# Patient Record
Sex: Male | Born: 2000 | Race: White | Hispanic: No | Marital: Single | Smoking: Current every day smoker
Health system: Southern US, Community
[De-identification: ages and names within clinical notes are randomized; demographics above are authoritative.]

## PROBLEM LIST (undated history)

## (undated) ENCOUNTER — Emergency Department (HOSPITAL_COMMUNITY): Admission: EM | Payer: Medicaid Other | Source: Home / Self Care

## (undated) DIAGNOSIS — F909 Attention-deficit hyperactivity disorder, unspecified type: Secondary | ICD-10-CM

## (undated) DIAGNOSIS — F429 Obsessive-compulsive disorder, unspecified: Secondary | ICD-10-CM

---
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
## 2022-05-05 ENCOUNTER — Encounter (HOSPITAL_COMMUNITY): Payer: Self-pay | Admitting: Registered Nurse

## 2022-05-05 ENCOUNTER — Ambulatory Visit (HOSPITAL_COMMUNITY)
Admission: EM | Admit: 2022-05-05 | Discharge: 2022-05-05 | Disposition: A | Discharge status: Home / Self Care | Payer: Medicaid Other | Attending: Registered Nurse | Admitting: Registered Nurse

## 2022-05-05 DIAGNOSIS — F902 Attention-deficit hyperactivity disorder, combined type: Secondary | ICD-10-CM

## 2022-05-05 DIAGNOSIS — F70 Mild intellectual disabilities: Secondary | ICD-10-CM

## 2022-05-05 DIAGNOSIS — R4689 Other symptoms and signs involving appearance and behavior: Secondary | ICD-10-CM

## 2022-05-05 DIAGNOSIS — F918 Other conduct disorders: Secondary | ICD-10-CM | POA: Insufficient documentation

## 2022-05-05 DIAGNOSIS — Z046 Encounter for general psychiatric examination, requested by authority: Secondary | ICD-10-CM

## 2022-05-05 MED ORDER — GUANFACINE HCL ER 2 MG PO TB24
4.0000 mg | ORAL_TABLET | ORAL | Status: AC
  Administered 2022-05-05: 4 mg via ORAL
  Filled 2022-05-05: qty 2

## 2022-05-05 MED ORDER — DIVALPROEX SODIUM 500 MG PO DR TAB
750.0000 mg | DELAYED_RELEASE_TABLET | ORAL | Status: AC
  Administered 2022-05-05: 750 mg via ORAL
  Filled 2022-05-05: qty 1

## 2022-05-05 MED ORDER — LORAZEPAM 1 MG PO TABS
2.0000 mg | ORAL_TABLET | ORAL | Status: AC
  Administered 2022-05-05: 2 mg via ORAL
  Filled 2022-05-05: qty 2

## 2022-05-05 MED ORDER — METOPROLOL TARTRATE 25 MG PO TABS: 25.0000 mg | ORAL_TABLET | Freq: Every morning | ORAL | Status: DC

## 2022-05-05 MED ORDER — DIVALPROEX SODIUM 500 MG PO DR TAB: 750.0000 mg | DELAYED_RELEASE_TABLET | Freq: Two times a day (BID) | ORAL | Status: DC

## 2022-05-05 MED ORDER — CLOZAPINE 25 MG PO TABS
150.0000 mg | ORAL_TABLET | ORAL | Status: AC
  Administered 2022-05-05: 150 mg via ORAL
  Filled 2022-05-05: qty 2

## 2022-05-05 MED ORDER — LEVOTHYROXINE SODIUM 25 MCG PO TABS: 25.0000 ug | ORAL_TABLET | Freq: Every day | ORAL | Status: DC

## 2022-05-05 MED ORDER — METOPROLOL TARTRATE 25 MG PO TABS
25.0000 mg | ORAL_TABLET | ORAL | Status: AC
  Administered 2022-05-05: 25 mg via ORAL
  Filled 2022-05-05: qty 1

## 2022-05-05 MED ORDER — GUANFACINE HCL ER 2 MG PO TB24: 4.0000 mg | ORAL_TABLET | Freq: Every day | ORAL | Status: DC

## 2022-05-05 MED ORDER — LEVOTHYROXINE SODIUM 25 MCG PO TABS
25.0000 ug | ORAL_TABLET | ORAL | Status: AC
  Administered 2022-05-05: 25 ug via ORAL
  Filled 2022-05-05: qty 1

## 2022-05-05 MED ORDER — CLOZAPINE 25 MG PO TABS: 150.0000 mg | ORAL_TABLET | Freq: Every day | ORAL | Status: DC

## 2022-05-05 MED ORDER — LORAZEPAM 1 MG PO TABS: 2.0000 mg | ORAL_TABLET | Freq: Once | ORAL | Status: DC

## 2022-05-05 NOTE — ED Provider Notes (Signed)
Behavioral Health Urgent Care Medical Screening Exam  Patient Name: Stephen Robinson MRN: [id number] Date of Evaluation: 05/05/22 Chief Complaint:  IVC Diagnosis:  Final diagnoses:  Involuntary commitment  Aggressive behavior of adult  Mild intellectual disability  ADHD (attention deficit hyperactivity disorder), combined type    History of Present illness: Stephen Robinson is a 22 y.o. male patient presented to [facility] as a walk in voluntarily via [employer].  Officer reports that staff at [facility] called police because patient had been sitting outside on a bench asking multiple people for help.  Stephen Robinson, 24 y.o., male patient seen face to face by this provider, consulted with Dr. Hampton Abbot; and chart reviewed on 05/05/22.  On evaluation Stephen Robinson reports he had been grocery shopping "cause I was hungry."  States he was asking people for help "because I needed a ride back to my motel."   Patient states that he was released from jail last night.  States he talked to his Guardian Stephen Robinson and "she knows that I won't going back to the assisted living.  She said that I was grown and she couldn't make me go back, but I had to let her know where I was staying.  But I forgot to call her last night.  Patient reports that he had went to the grocery store today but couldn't get back to the motel and was asking for assistance to get back to the motel. Patient reports that he is grown and that he does not have to go back to the assistant living facility. Patient denies suicidal/self-harm/homicidal ideation, psychosis, paranoia.  Patient gave permission to speak to his legal guardian During evaluation Stephen Robinson is sitting in chair with no noted distress.  He is alert/oriented x 4, calm, cooperative, attentive, and responses were relevant and appropriate to assessment questions.  He spoke in a clear tone at moderate volume, and normal pace, with good eye  contact.   He denies suicidal/self-harm/homicidal ideation, psychosis, and paranoia.  Objectively:  there is no evidence of psychosis/mania or delusional thinking.  He conversed coherently, with goal directed thoughts, and no distractibility, or pre-occupation and he has denied suicidal/self-harm/homicidal ideation, psychosis, and paranoia.   Collateral information:  Spoke to [name] (legal guardian) and Stephen Robinson (ALF staff person).  Stephen Robinson states reports that patient had his first appearance in court yesterday related to stealing, breaking convenience store window, and spitting on a Engineer, structural.  "We Got there 5 minutes too late. Police let him sign himself out. When we met with him, he did not want to leave with Stephen Robinson. The police told him he could either go back to the group home or he could stay in jail. He agreed to go back to the group home, but he wanted to go by the hospital first because he said it felt like his collarbone was broken. He was taken to the hospital and after he was released from the hospital, he told he said that he wasn't going back to the assisted living facility that he wanted to be on his own. I wasn't able to force him to go back to the group home." Report patient has been deemed incompetent and he is unable to his own, but he wants to, and he wants to be independent.  But he is incapable of making sound decisions.  He hs been out of jail one night and already has a girlfriend living in a motel.  With someone he really doesn't know."  Group home staff person Stephen Robinson reports they don't have a problem with patient coming back to assisted living facility. Reports patient has missed 2 days of his medications. And would like for patient to take medications prior to his discharge just in case he refuses when he gets home.  States she will fax a updated list of medications over.  Informed that medications could be given.  Also inform staff that patient has been calm and cooperative while he  was here, he is conversing coherently with staff, with no agitation, or behavior outburst while here in facility.  Patient has denied suicidal, homicidal ideation psychosis, and paranoia.  The incident that is stated on the IVC is nothing that just occurred, and patient couldn't be forced to stay here.  Understanding voice. Reported they would like to speak with patient, and they could also arrange transportation for patient to get back to the care facility.    [facility] from 02/18/2021 in [facility] Emergency Department at [facility] ED from 11/20/2020 in [facility] Emergency Department at [facility] ED from 10/30/2020 in [facility] Emergency Department at [facility] High Risk No Risk No Risk       Psychiatric Specialty Exam  Presentation  General Appearance:Appropriate for Environment  Eye Contact:Good  Speech:Clear and Coherent; Normal Rate  Speech Volume:Normal  Handedness:Right   Mood and Affect  Mood: Euthymic  Affect: Appropriate; Congruent   Thought Process  Thought Processes: Coherent; Goal Directed  Descriptions of Associations:Intact  Orientation:Full (Time, Place and Person)  Thought Content:Logical  Diagnosis of Schizophrenia or Schizoaffective disorder in past: No data recorded  Hallucinations:None  Ideas of Reference:None  Suicidal Thoughts:No  Homicidal Thoughts:No   Sensorium  Memory: Immediate Good; Recent Good; Remote Fair  Judgment: Fair  Insight: Present   Executive Functions  Concentration: Fair  Attention Span: Fair  Recall: Good  Fund of Knowledge: Fair  Language: Good   Psychomotor Activity  Psychomotor Activity: Normal   Assets  Assets: Communication Skills; Desire for Improvement; Financial Resources/Insurance; Leisure Time; Physical Health; Social Support   Sleep  Sleep: Good  Number of hours: No data recorded  Physical Exam: Physical  Exam Vitals and nursing note reviewed. Exam conducted with a chaperone present.  Constitutional:      General: He is not in acute distress.    Appearance: Normal appearance. He is not ill-appearing.  HENT:     Head: Normocephalic.  Eyes:     Conjunctiva/sclera: Conjunctivae normal.  Cardiovascular:     Rate and Rhythm: Normal rate.  Pulmonary:     Effort: Pulmonary effort is normal. No respiratory distress.  Musculoskeletal:        General: Normal range of motion.     Cervical back: Normal range of motion.  Skin:    General: Skin is warm and dry.  Neurological:     Mental Status: He is alert and oriented to person, place, and time.  Psychiatric:        Attention and Perception: Attention and perception normal. He does not perceive auditory or visual hallucinations.        Mood and Affect: Mood and affect normal.        Speech: Speech normal.        Behavior: Behavior normal. Behavior is cooperative.        Thought Content: Thought content normal. Thought content is not paranoid or delusional. Thought content does not include homicidal or suicidal ideation.  Judgment: Judgment is impulsive.    Review of Systems  Psychiatric/Behavioral:  Depression: Denies. Hallucinations: States he smoked  joint today.  I didn't get high just lil bit high. Substance abuse: Denies. Suicidal ideas: Denies. The patient does not have insomnia. Nervous/anxious: Denies.  All other systems reviewed and are negative.  Blood pressure (!) 152/85, pulse 89, temperature 98.5 F (36.9 C), temperature source Oral, resp. rate 19, SpO2 100 %. There is no height or weight on file to calculate BMI.  Musculoskeletal: Strength & Muscle Tone: within normal limits Gait & Station: normal Patient leans: N/A   [place] MSE Discharge Disposition for Follow up and Recommendations: Based on my evaluation the patient does not appear to have an emergency medical condition and can be discharged with resources and  follow up care in outpatient services for Medication Management, Individual Therapy, and Psychiatrically cleared to go back to group home.    Rescind IVC After thorough evaluation and review of information currently presented on assessment of Dhruvan Gullion (respondent), there is insufficient findings to indicate respondent meets criteria for involuntary commitment or require an inpatient level of care.  Respondent is alert/oriented x 4, calm, cooperative, and mood congruent with affect.  Respondent is speaking in a clear tone at moderate volume, and normal pace, with good eye contact.  Respondents' thought process is coherent, relevant, and there is no indication that the respondent is currently responding to internal/external stimuli or experiencing delusional thought content.  Respondent denies suicidal/self-harm/homicidal ideation, psychosis, and paranoia.  Respondent has remained calm and cooperative throughout assessment and responded to questions appropriately.  Currently respondent is not significantly impaired, psychotic, or manic on exam.  A detailed risk assessment has been completed based on clinical exam and individual risk factors.  There is no evidence of imminent risk to self or others at present and respondent does not meet criteria for psychiatric inpatient admission.  Medications Ordered to be given prior to discharge Meds ordered this encounter  Medications   cloZAPine (CLOZARIL) tablet 150 mg   divalproex (DEPAKOTE) DR tablet 750 mg   guanFACINE (INTUNIV) ER tablet 4 mg   levothyroxine (SYNTHROID) tablet 25 mcg   metoprolol tartrate (LOPRESSOR) tablet 25 mg     Kitt Minardi, NP 05/05/2022, 7:03 PM

## 2022-05-05 NOTE — Progress Notes (Signed)
PHARMACIST - PHYSICIAN ORDER COMMUNICATION  Stephen Robinson is a 22 y.o. year old male with a history of  schizophrenia on Clozapine PTA. Continuing this medication order as an inpatient requires that monitoring parameters per REMS requirements must be met.   Clozapine REMS Dispense Authorization was obtained, and will dispense inpatient.  RDA code [id number].  Verified Clozapine dose:    Last ANC value and date reported on the Clozapine REMS website: 6500 on 04/26/2022 [place] monitoring frequency: weekly Next [place] reporting is due on (date) 05/03/22.  Eudelia Bunch, Pharm.D 05/05/2022 7:16 PM

## 2022-05-05 NOTE — BH Assessment (Signed)
LCSW Progress Note   LCSW contacted legal guardian, Stephen Robinson @ [phone] and left a VM requesting a call back.  LCSW contacted Ms. Stephen Robinson at [phone] to discuss discharge and inform her that he will be given his evening medications prior to leaving the facility.  She stated she will be here between 2030-2100.   Omelia Blackwater, MSW, [place] phone [phone] fax

## 2022-05-05 NOTE — Discharge Instructions (Signed)
Take all of you medications as prescribed by your mental healthcare provider.  Report any adverse effects and reactions from your medications to your outpatient provider promptly.  Do not engage in alcohol and or illegal drug use while on prescription medicines. Keep all scheduled appointments. This is to ensure that you are getting refills on time and to avoid any interruption in your medication.  If you are unable to keep an appointment call to reschedule.  Be sure to follow up with resources and follow ups given. In the event of worsening symptoms call the crisis hotline, 911, and or go to the nearest emergency department for appropriate evaluation and treatment of symptoms. Follow-up with your primary care provider for your medical issues, concerns and or health care needs.    [facility] Address: [address], [place], [place] [address] Phone: [phone]  Supported Employment The supported employment program is a person-centered, individualized, evidence-based support service that helps members choose, acquire, and maintain competitive employment in our community. This service supports the varying needs of individuals and promotes community inclusion and employment success. Members enrolled in the supported employment program can expect the following:  Development of an individual career plan Community based job placement Job shadowing Job development On-site job Teacher, early years/pre and support  Supported Education Supported education helps our members receive the education and training they need to achieve their learning and recovery goals. This will assist members with becoming gainfully employed in the job or career of their choice. The program includes assistance with: Registering for disability accommodations Enrolling in school and registering for classes Learning communication skills Scheduling tutoring sessions within your school [facility] partners with  Vocational Rehabilitation to help increase the success of clients seeking employment and educational goals.  Want to learn more about our programs?   Please contact our intake department INTAKE: [phone] Ext [phone]  Mailing: Hand   [place], [place] [address]   [email or web address]

## 2022-05-05 NOTE — ED Notes (Signed)
[name] called for pickup.

## 2022-05-05 NOTE — Progress Notes (Signed)
   05/05/22 1720  [place] (Walk-ins at [facility] only)  How Did You Hear About [name]? Legal System  What Is the Reason for Your Visit/Call Today? Patient presents via [employer], under IVC initiated by QP with Care Home where he has been residing.  Patient was picked up at [facility], when he called 911 for assistance with "a ride back to the [facility]."  Patient was in the care home, howeer has refused to return.  He was taken to jail for larceny and released on the condition by guardian, Tommie Ard, that he return to care home.  Patient wanted to walk back to the facility and then walked to this motel instead.  There was concern by caretakers that he has been off meds for a few days and doesn't have his meds with him.  Patient has had multiple inpatient admissions, with recent [facility] admission, and they are concerned he will decompensate without medication.  Patient is requesting to return to the motel, stating he has met a couple of male friends that are letting him stay in their room.  He also states he has "a girlfriend" that stays there.  Guardian, Jonte GXQ([phone]) was contacted and shares concerns that he is deemed incompetent and is unable to live on his own.  Patient is low functioning and struggles with prorcessing and decision making.  He denies SI, HI and AVH.  He is calm and cooperative at this time.  Guardian informed that IVC criteria listed is old information from before he was incarcerated. Guardian has requested patient get his pm medications and would like patient to speak wiht care home staff, as they would like to ask him to return.  Patient is psych cleared by provider.  How Long Has This Been Causing You Problems? <Week  Have You Recently Had Any Thoughts About Hurting Yourself? No  Are You Planning to Commit Suicide/Harm Yourself At This time? No  Have you Recently Had Thoughts About [place]? No  Are You Planning To Harm Someone At This Time? No  Are you currently  experiencing any auditory, visual or other hallucinations? No  Have You Used Any Alcohol or Drugs in the Past 24 Hours? Yes  How long ago did you use Drugs or Alcohol? today  What Did You Use and How Much? THC "just a little"  Do you have any current medical co-morbidities that require immediate attention? No  Clinician description of patient physical appearance/behavior: calm, cooperative AAOx4, limited insight due to cognitive impairment  What Do You Feel Would Help You the Most Today? Social Support  If access to [facility] Urgent Care was not available, would you have sought care in the Emergency Department? No  Determination of Need Routine (7 days)  Options For Referral Other: Comment (coordination with [facility], re: patient returning to facility vs motel where he stayed last night. Facility staff plan to speak with patient to invite him to return.)

## 2022-07-09 ENCOUNTER — Ambulatory Visit (HOSPITAL_COMMUNITY)
Admission: EM | Admit: 2022-07-09 | Discharge: 2022-07-10 | Disposition: A | Discharge status: Home / Self Care | Payer: Medicaid Other | Attending: Urology | Admitting: Urology

## 2022-07-09 ENCOUNTER — Other Ambulatory Visit: Payer: Self-pay

## 2022-07-09 ENCOUNTER — Encounter (HOSPITAL_COMMUNITY): Payer: Self-pay | Admitting: Emergency Medicine

## 2022-07-09 DIAGNOSIS — R45851 Suicidal ideations: Secondary | ICD-10-CM | POA: Diagnosis not present

## 2022-07-09 DIAGNOSIS — R45850 Homicidal ideations: Secondary | ICD-10-CM | POA: Insufficient documentation

## 2022-07-09 DIAGNOSIS — Z9151 Personal history of suicidal behavior: Secondary | ICD-10-CM | POA: Insufficient documentation

## 2022-07-09 DIAGNOSIS — I517 Cardiomegaly: Secondary | ICD-10-CM | POA: Insufficient documentation

## 2022-07-09 DIAGNOSIS — Z1152 Encounter for screening for COVID-19: Secondary | ICD-10-CM | POA: Insufficient documentation

## 2022-07-09 DIAGNOSIS — F913 Oppositional defiant disorder: Secondary | ICD-10-CM | POA: Insufficient documentation

## 2022-07-09 DIAGNOSIS — R441 Visual hallucinations: Secondary | ICD-10-CM | POA: Insufficient documentation

## 2022-07-09 DIAGNOSIS — F1290 Cannabis use, unspecified, uncomplicated: Secondary | ICD-10-CM | POA: Insufficient documentation

## 2022-07-09 DIAGNOSIS — F902 Attention-deficit hyperactivity disorder, combined type: Secondary | ICD-10-CM | POA: Insufficient documentation

## 2022-07-09 DIAGNOSIS — F17210 Nicotine dependence, cigarettes, uncomplicated: Secondary | ICD-10-CM | POA: Insufficient documentation

## 2022-07-09 DIAGNOSIS — R4689 Other symptoms and signs involving appearance and behavior: Secondary | ICD-10-CM

## 2022-07-09 DIAGNOSIS — Z765 Malingerer [conscious simulation]: Secondary | ICD-10-CM | POA: Diagnosis not present

## 2022-07-09 DIAGNOSIS — Z79899 Other long term (current) drug therapy: Secondary | ICD-10-CM | POA: Insufficient documentation

## 2022-07-09 DIAGNOSIS — R440 Auditory hallucinations: Secondary | ICD-10-CM | POA: Insufficient documentation

## 2022-07-09 DIAGNOSIS — Z653 Problems related to other legal circumstances: Secondary | ICD-10-CM | POA: Insufficient documentation

## 2022-07-09 LAB — POCT URINE DRUG SCREEN - MANUAL ENTRY (I-SCREEN)
POC Amphetamine UR: NOT DETECTED
POC Buprenorphine (BUP): NOT DETECTED
POC Cocaine UR: NOT DETECTED
POC Marijuana UR: NOT DETECTED
POC Methadone UR: NOT DETECTED
POC Methamphetamine UR: NOT DETECTED
POC Morphine: NOT DETECTED
POC Oxazepam (BZO): NOT DETECTED
POC Oxycodone UR: NOT DETECTED
POC Secobarbital (BAR): NOT DETECTED

## 2022-07-09 LAB — COMPREHENSIVE METABOLIC PANEL
ALT: 23 U/L (ref 0–44)
AST: 47 U/L — ABNORMAL HIGH (ref 15–41)
Albumin: 4.3 g/dL (ref 3.5–5.0)
Alkaline Phosphatase: 71 U/L (ref 38–126)
Anion gap: 12 (ref 5–15)
BUN: 8 mg/dL (ref 6–20)
CO2: 26 mmol/L (ref 22–32)
Calcium: 10 mg/dL (ref 8.9–10.3)
Chloride: 101 mmol/L (ref 98–111)
Creatinine, Ser: 0.76 mg/dL (ref 0.61–1.24)
GFR, Estimated: >60 mL/min (ref >60)
Glucose, Bld: 92 mg/dL (ref 70–99)
Potassium: 4.1 mmol/L (ref 3.5–5.1)
Sodium: 139 mmol/L (ref 135–145)
Total Bilirubin: 0.6 mg/dL (ref 0.3–1.2)
Total Protein: 7.3 g/dL (ref 6.5–8.1)

## 2022-07-09 LAB — CBC WITH DIFFERENTIAL/PLATELET
Abs Immature Granulocytes: 0.03 10*3/uL (ref 0.00–0.07)
Basophils Absolute: 0 10*3/uL (ref 0.0–0.1)
Basophils Relative: 0 %
Eosinophils Absolute: 0.1 10*3/uL (ref 0.0–0.5)
Eosinophils Relative: 1 %
HCT: 36.7 % — ABNORMAL LOW (ref 39.0–52.0)
Hemoglobin: 13.4 g/dL (ref 13.0–17.0)
Immature Granulocytes: 0 %
Lymphocytes Relative: 33 %
Lymphs Abs: 3.3 10*3/uL (ref 0.7–4.0)
MCH: 31.6 pg (ref 26.0–34.0)
MCHC: 36.5 g/dL — ABNORMAL HIGH (ref 30.0–36.0)
MCV: 86.6 fL (ref 80.0–100.0)
Monocytes Absolute: 1.1 10*3/uL — ABNORMAL HIGH (ref 0.1–1.0)
Monocytes Relative: 10 %
Neutro Abs: 5.6 10*3/uL (ref 1.7–7.7)
Neutrophils Relative %: 56 %
Platelets: 236 10*3/uL (ref 150–400)
RBC: 4.24 MIL/uL (ref 4.22–5.81)
RDW: 13.5 % (ref 11.5–15.5)
WBC: 10.2 10*3/uL (ref 4.0–10.5)
nRBC: 0 % (ref 0.0–0.2)

## 2022-07-09 LAB — LIPID PANEL
Cholesterol: 170 mg/dL (ref 0–200)
HDL: 43 mg/dL (ref >40)
LDL Cholesterol: 98 mg/dL (ref 0–99)
Total CHOL/HDL Ratio: 4 RATIO
Triglycerides: 147 mg/dL (ref <150)
VLDL: 29 mg/dL (ref 0–40)

## 2022-07-09 LAB — VALPROIC ACID LEVEL: Valproic Acid Lvl: 59 ug/mL (ref 50.0–100.0)

## 2022-07-09 LAB — TSH: TSH: 6.001 u[IU]/mL — ABNORMAL HIGH (ref 0.350–4.500)

## 2022-07-09 LAB — POC SARS CORONAVIRUS 2 AG: SARSCOV2ONAVIRUS 2 AG: NEGATIVE

## 2022-07-09 LAB — SARS CORONAVIRUS 2 BY RT PCR: SARS Coronavirus 2 by RT PCR: NEGATIVE

## 2022-07-09 LAB — ETHANOL: Alcohol, Ethyl (B): <10 mg/dL (ref <10)

## 2022-07-09 MED ORDER — DIVALPROEX SODIUM 500 MG PO DR TAB
750.0000 mg | DELAYED_RELEASE_TABLET | Freq: Two times a day (BID) | ORAL | Status: DC
  Administered 2022-07-09 – 2022-07-10 (×2): 750 mg via ORAL
  Filled 2022-07-09 (×2): qty 1

## 2022-07-09 MED ORDER — HYDROXYZINE HCL 25 MG PO TABS
25.0000 mg | ORAL_TABLET | Freq: Three times a day (TID) | ORAL | Status: DC | PRN
  Administered 2022-07-09 – 2022-07-10 (×2): 25 mg via ORAL
  Filled 2022-07-09 (×2): qty 1

## 2022-07-09 MED ORDER — LEVOTHYROXINE SODIUM 25 MCG PO TABS
25.0000 ug | ORAL_TABLET | Freq: Every day | ORAL | Status: DC
  Administered 2022-07-10: 25 ug via ORAL
  Filled 2022-07-09: qty 1

## 2022-07-09 MED ORDER — CLOZAPINE 25 MG PO TABS
150.0000 mg | ORAL_TABLET | Freq: Every day | ORAL | Status: DC
  Administered 2022-07-09: 150 mg via ORAL
  Filled 2022-07-09: qty 2

## 2022-07-09 MED ORDER — HALOPERIDOL 5 MG PO TABS
10.0000 mg | ORAL_TABLET | Freq: Four times a day (QID) | ORAL | Status: DC | PRN
  Administered 2022-07-09: 10 mg via ORAL
  Filled 2022-07-09: qty 2

## 2022-07-09 MED ORDER — CLOZAPINE 25 MG PO TABS
75.0000 mg | ORAL_TABLET | Freq: Every morning | ORAL | Status: DC
  Administered 2022-07-10: 75 mg via ORAL
  Filled 2022-07-09: qty 3

## 2022-07-09 MED ORDER — DIPHENHYDRAMINE HCL 50 MG PO CAPS
50.0000 mg | ORAL_CAPSULE | Freq: Four times a day (QID) | ORAL | Status: DC | PRN
  Administered 2022-07-09: 50 mg via ORAL
  Filled 2022-07-09: qty 1

## 2022-07-09 MED ORDER — GUANFACINE HCL ER 2 MG PO TB24
4.0000 mg | ORAL_TABLET | Freq: Every day | ORAL | Status: DC
  Administered 2022-07-09: 4 mg via ORAL
  Filled 2022-07-09: qty 2

## 2022-07-09 MED ORDER — ALUM & MAG HYDROXIDE-SIMETH 200-200-20 MG/5ML PO SUSP: 30.0000 mL | ORAL | Status: DC | PRN

## 2022-07-09 MED ORDER — METOPROLOL TARTRATE 25 MG PO TABS
12.5000 mg | ORAL_TABLET | Freq: Every morning | ORAL | Status: DC
  Administered 2022-07-10: 12.5 mg via ORAL
  Filled 2022-07-09: qty 1

## 2022-07-09 MED ORDER — ACETAMINOPHEN 325 MG PO TABS: 650.0000 mg | ORAL_TABLET | Freq: Four times a day (QID) | ORAL | Status: DC | PRN

## 2022-07-09 MED ORDER — MAGNESIUM HYDROXIDE 400 MG/5ML PO SUSP: 30.0000 mL | Freq: Every day | ORAL | Status: DC | PRN

## 2022-07-09 NOTE — ED Provider Notes (Incomplete)
[facility]gent [facility] Continuous Assessment Admission H&P  Date: 07/10/22 Patient Name: Stephen Robinson MRN: [id number] Chief Complaint: "I'm suicidal"  Diagnoses:  Final diagnoses:  Attention deficit hyperactivity disorder (ADHD), combined type  Aggressive behavior  Malingering    HPI: Stephen Robinson is is a 22 year old male with history of IDD, ADHD, ADHD, oppositional defiant behavior, and aggressive behavior.  Patient was brought voluntarily to [facility] by law enforcement for a walk-in assessment after the patient eloped from his day program.  Patient was evaluated face-to-face and his chart was reviewed by this nurse practitioner.  On evaluation, patient is alert and oriented x 4, patient is anxious but cooperative.  He speaks in a clear tone of voice, increased volume, and normal pace.  Patient's mood is anxious and his affect is congruent with his mood.  Patient's thought process is coherent.  Objectively, no signs of mania, psychosis, preoccupation, or delusional thought content noted during assessment.  Patient acknowledges that he left his day program without permission. He says he walked to a golf [place] with a knife that he found and was threatening to stab himself and someone called the police. He says he threw the knife in a creek. Patient is diagnosed with IDD and has a legal guardian, Stephen Robinson [phone] with [employer] [facility] [employer] and resides at ALF (ALF director Stephen Robinson, LCSWA [phone]). Patient reports that he does not want to return to ALF. He says he is having suicidal thoughts with plan to cut his wrist or jump out of a window head first.  He says he is unable to contract for safety and would commit suicidal if discharge to his current ALF. He also endorses homicidal ideation towards ALF "male staffs" and says he would destroy properties if D/C to his current ALF. He is requesting to be sent to [facility] because "they have a lot of activities."    Patient denies depressive symptoms. He endorses visual hallucination of "imaginary friend name Stephen Robinson" and auditory hallucination "conversations with Stephen Robinson."  He denies paranoia. He says he steal alcoholic beverages from stores. He reports "I drink as much alcohol as I can get." He is unsure when he last had an alcoholic drink. He says he vapes nicotine daily. He denies all other substance use. He has history of stealing from stores and was recently arrested for stealing food from a store and threatening the owner of the store. He was charged with 2 felonies and 2 misdemeanors, including assaulting a Emergency planning/management officer. Patient reports that he has access to guns and knives; and that he robbed a [employer] at gun point however ALF staff says this is untrue. She reports that patient is obsessed with guns and makes up stories about robbing people at gun point. She says patient has no access to weapon, she says all knives and potential weapons at ALF are locked away. ALF director Stephen Robinson denies safety concerns and says patient may return to ALF however patient continues to make threats to harm himself and others if discharge to current ALF.    " Ms Stephen Robinson states Pt has been in DSS custody since age 22 and has been in multiple placements. She says he has a long history of elopement from various placements. She states he has a fascination with Designer, television/film set, seeks out interactions with them, and dreams of having a career in Patent examiner. She says he recently turned 22 and is frustrated he has to live in a structured  environment. She describes his current placement as being the most stable and that Pt has a lot of support, including medication management, day treatment program, a behavioral support plan, and legal guardian. Ms Stephen Robinson spoke with Pt about his concerns regarding the AFL and day treatment but Pt was says he wants to live somewhere else. 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 Pt recently went to jail after stealing food from a store and threatening the store owner. He was charged with two felonies and two misdemeanors, including assaulting a Hydrographic surveyor. Pt says he had a gun and was charged with armed robbery but Ms Stephen Robinson says this is not true, that Pt stole food and broke a window. He currently has no legal charges. Pt says he has been psychiatrically hospitalized in the past and would like to return to [facility], that they have a lot of activities there. He says he takes all his medications when they are given to him.   Home medication per Ms Stephen Robinson Divaproex 750mg  twice/day  Clonazepam 1 mg twice daily as needed for anxiety (patient has not been taking this since  he was started on zyprexa)  Benadryl 50 mg as needed for agitation Guanfacine 4 mg/day Levothyroxine 25 mcg Qam Metoprolol 12.5mg /day Haldol 10mg  every 4 hours as needed for agitation Clozaril 75mg  every morning and 150mg  at night Zyprexa 5mg  BID as needed for agitation   Total Time spent with patient: 30 minutes  Musculoskeletal  Strength & Muscle Tone: within normal limits Gait & Station: normal Patient leans: Right  Psychiatric Specialty Exam  Presentation General Appearance:  Appropriate for Environment  Eye Contact: Good  Speech: Clear and Coherent  Speech Volume: Increased  Handedness: Right   Mood and Affect  Mood: Euthymic  Affect: Congruent   Thought Process  Thought Processes: Goal Directed  Descriptions of Associations:Intact  Orientation:Full (Time, Place and Person)  Thought Content:WDL  Diagnosis of Schizophrenia or Schizoaffective disorder in past: No   Hallucinations:Hallucinations: Auditory; Visual Description of Auditory Hallucinations: "conversation with imaginary friend Stephen Robinson" Description of  Visual Hallucinations: "I see Stephen Robinson, my imaginary friend"  Ideas of Reference:None  Suicidal Thoughts:Suicidal Thoughts: Yes, Active SI Active Intent and/or Plan: With Plan; With Intent  Homicidal Thoughts:Homicidal Thoughts: Yes, Active HI Active Intent and/or Plan: With Plan; With Intent   Sensorium  Memory: Immediate Good; Recent Fair; Remote Fair  Judgment: Poor  Insight: Fair   Chartered certified accountant: Fair  Attention Span: Fair  Recall: [employer] of Knowledge: Fair  Language: Fair   Psychomotor Activity  Psychomotor Activity: Psychomotor Activity: Normal   Assets  Assets: Manufacturing systems engineer; Physical Health; Transportation; Social Support   Sleep  Sleep: Sleep: Good Number of Hours of Sleep: 8   Nutritional Assessment (For OBS and FBC admissions only) Has the patient had a weight loss or gain of 10 pounds or more in the last 3 months?: No Has the patient had a decrease in food intake/or appetite?: No Does the patient have dental problems?: No Does the patient have eating habits or behaviors that may be indicators of an eating disorder including binging or inducing vomiting?: No Has the patient recently lost weight without trying?: 0 Has the patient been eating poorly because of a decreased appetite?: 0 Malnutrition Screening Tool Score: 0    Physical Exam Vitals and nursing note reviewed.  Constitutional:      General: He is not in acute distress.    Appearance: He is well-developed.  HENT:     Head: Normocephalic and atraumatic.  Eyes:     Conjunctiva/sclera: Conjunctivae normal.  Cardiovascular:     Rate and Rhythm: Normal rate.     Heart sounds: No murmur heard. Pulmonary:     Effort: Pulmonary effort is normal. No respiratory distress.  Abdominal:     Palpations: Abdomen is soft.     Tenderness: There is no abdominal tenderness.  Musculoskeletal:        General: No swelling.     Cervical back: Neck supple.   Skin:    General: Skin is warm and dry.     Capillary Refill: Capillary refill takes less than 2 seconds.  Neurological:     Mental Status: He is alert and oriented to person, place, and time. Mental status is at baseline.  Psychiatric:        Attention and Perception: He perceives auditory and visual hallucinations.        Mood and Affect: Mood is anxious.        Speech: Speech normal.        Behavior: Behavior normal. Behavior is cooperative.        Thought Content: Thought content is not paranoid or delusional. Thought content includes homicidal and suicidal ideation. Thought content includes homicidal and suicidal plan.    Review of Systems  Constitutional: Negative.   HENT: Negative.    Eyes: Negative.   Respiratory: Negative.    Cardiovascular: Negative.   Gastrointestinal: Negative.   Genitourinary: Negative.   Musculoskeletal: Negative.   Skin: Negative.   Neurological: Negative.   Endo/Heme/Allergies: Negative.   Psychiatric/Behavioral:  Positive for hallucinations, substance abuse and suicidal ideas. The patient is nervous/anxious.     Blood pressure 129/79, pulse 93, temperature 97.9 F (36.6 C), temperature source Oral, resp. rate 18, height 6' (1.829 m), weight 202 lb (91.6 kg), SpO2 99 %. Body mass index is 27.4 kg/m.  Past Psychiatric History:  Past Medical History:  Diagnosis Date   ADHD    OCD (obsessive compulsive disorder)       Is the patient at risk to self? Yes  Has the patient been a risk to self in the past 6 months? Yes .    Has the patient been a risk to self within the distant past? Yes   Is the patient a risk to others? Yes   Has the patient been a risk to others in the past 6 months? No   Has the patient been a risk to others within the distant past? No   Past Medical History:  Past Medical History:  Diagnosis Date   ADHD    OCD (obsessive compulsive disorder)      Family History:History reviewed. No pertinent family history. Legal  guardian is not available to provide history. Patient is dx with IDD and unable to provide history.      Social History:  Social History   Tobacco Use   Smoking status: Every Day    Types: Cigarettes   Smokeless tobacco: Never  Vaping Use   Vaping Use: Every day  Substance Use Topics   Alcohol use: Yes   Drug use: Yes    Types: Marijuana     Last Labs:  Admission on 07/09/2022  Component Date Value Ref Range Status   SARS Coronavirus 2 by RT PCR 07/09/2022 NEGATIVE  NEGATIVE Final  Performed at [facility], [address] [place], [place] [address]   WBC 07/09/2022 10.2  4.0 - 10.5 K/uL Final   RBC 07/09/2022 4.24  4.22 - 5.81 MIL/uL Final   Hemoglobin 07/09/2022 13.4  13.0 - 17.0 g/dL Final   HCT 60/45/409804/08/2022 36.7 (L)  39.0 - 52.0 % Final   MCV 07/09/2022 86.6  80.0 - 100.0 fL Final   MCH 07/09/2022 31.6  26.0 - 34.0 pg Final   MCHC 07/09/2022 36.5 (H)  30.0 - 36.0 g/dL Final   RDW 11/91/478204/08/2022 13.5  11.5 - 15.5 % Final   Platelets 07/09/2022 236  150 - 400 K/uL Final   nRBC 07/09/2022 0.0  0.0 - 0.2 % Final   Neutrophils Relative % 07/09/2022 56  % Final   Neutro Abs 07/09/2022 5.6  1.7 - 7.7 K/uL Final   Lymphocytes Relative 07/09/2022 33  % Final   Lymphs Abs 07/09/2022 3.3  0.7 - 4.0 K/uL Final   Monocytes Relative 07/09/2022 10  % Final   Monocytes Absolute 07/09/2022 1.1 (H)  0.1 - 1.0 K/uL Final   Eosinophils Relative 07/09/2022 1  % Final   Eosinophils Absolute 07/09/2022 0.1  0.0 - 0.5 K/uL Final   Basophils Relative 07/09/2022 0  % Final   Basophils Absolute 07/09/2022 0.0  0.0 - 0.1 K/uL Final   Immature Granulocytes 07/09/2022 0  % Final   Abs Immature Granulocytes 07/09/2022 0.03  0.00 - 0.07 K/uL Final   Performed at [facility], [address] [place], [place] [address]   Hgb A1c MFr Bld 07/09/2022 4.9  4.8 - 5.6 % Final   Comment: (NOTE) Pre diabetes:          5.7%-6.4%  Diabetes:              >6.4%  Glycemic control for    <7.0% adults with diabetes    Mean Plasma Glucose 07/09/2022 93.93  mg/dL Final   Performed at [facility], [address] [place], [place] [address]   Sodium 07/09/2022 139  135 - 145 mmol/L Final   Potassium 07/09/2022 4.1  3.5 - 5.1 mmol/L Final   Chloride 07/09/2022 101  98 - 111 mmol/L Final   CO2 07/09/2022 26  22 - 32 mmol/L Final   Glucose, Bld 07/09/2022 92  70 - 99 mg/dL Final   Glucose reference range applies only to samples taken after fasting for at least 8 hours.   BUN 07/09/2022 8  6 - 20 mg/dL Final   Creatinine, Ser 07/09/2022 0.76  0.61 - 1.24 mg/dL Final   Calcium 78/46/962904/08/2022 10.0  8.9 - 10.3 mg/dL Final   Total Protein 52/84/132404/08/2022 7.3  6.5 - 8.1 g/dL Final   Albumin 40/10/272504/08/2022 4.3  3.5 - 5.0 g/dL Final   AST 36/64/403404/08/2022 47 (H)  15 - 41 U/L Final   ALT 07/09/2022 23  0 - 44 U/L Final   Alkaline Phosphatase 07/09/2022 71  38 - 126 U/L Final   Total Bilirubin 07/09/2022 0.6  0.3 - 1.2 mg/dL Final   GFR, Estimated 07/09/2022 >60  >60 mL/min Final   Comment: (NOTE) Calculated using the CKD-EPI Creatinine Equation (2021)    Anion gap 07/09/2022 12  5 - 15 Final   Performed at [facility], [address] [place], [place] [address]   Alcohol, Ethyl (B) 07/09/2022 <10  <10 mg/dL Final   Comment: (NOTE) Lowest detectable limit for serum alcohol is 10  mg/dL.  For medical purposes only. Performed at [facility], [address] [place], [place] [address]    POC Amphetamine UR 07/09/2022 None Detected  NONE DETECTED (Cut Off Level 1000 ng/mL) Preliminary   POC Secobarbital (BAR) 07/09/2022 None Detected  NONE DETECTED (Cut Off Level 300 ng/mL) Preliminary   POC Buprenorphine (BUP) 07/09/2022 None Detected  NONE DETECTED (Cut Off Level 10 ng/mL) Preliminary   POC Oxazepam (BZO) 07/09/2022 None Detected  NONE DETECTED (Cut Off Level 300 ng/mL) Preliminary   POC Cocaine UR 07/09/2022 None Detected  NONE DETECTED (Cut Off Level 300 ng/mL) Preliminary    POC Methamphetamine UR 07/09/2022 None Detected  NONE DETECTED (Cut Off Level 1000 ng/mL) Preliminary   POC Morphine 07/09/2022 None Detected  NONE DETECTED (Cut Off Level 300 ng/mL) Preliminary   POC Methadone UR 07/09/2022 None Detected  NONE DETECTED (Cut Off Level 300 ng/mL) Preliminary   POC Oxycodone UR 07/09/2022 None Detected  NONE DETECTED (Cut Off Level 100 ng/mL) Preliminary   POC Marijuana UR 07/09/2022 None Detected  NONE DETECTED (Cut Off Level 50 ng/mL) Preliminary   SARSCOV2ONAVIRUS 2 AG 07/09/2022 NEGATIVE  NEGATIVE Final   Comment: (NOTE) SARS-CoV-2 antigen NOT DETECTED.   Negative results are presumptive.  Negative results do not preclude SARS-CoV-2 infection and should not be used as the sole basis for treatment or other patient management decisions, including infection  control decisions, particularly in the presence of clinical signs and  symptoms consistent with COVID-19, or in those who have been in contact with the virus.  Negative results must be combined with clinical observations, patient history, and epidemiological information. The expected result is Negative.  Fact Sheet for Patients: https://www.jennings-kim.com/https://www.fda.gov/media/141569/download  Fact Sheet for Healthcare Providers: https://alexander-rogers.biz/https://www.fda.gov/media/141568/download  This test is not yet approved or cleared by the [place] FDA and  has been authorized for detection and/or diagnosis of SARS-CoV-2 by FDA under an Emergency Use Authorization (EUA).  This EUA will remain in effect (meaning this test can be used) for the duration of  the COV                          ID-19 declaration under Section 564(b)(1) of the Act, 21 U.S.C. section 360bbb-3(b)(1), unless the authorization is terminated or revoked sooner.     Cholesterol 07/09/2022 170  0 - 200 mg/dL Final   Triglycerides 60/45/409804/08/2022 147  <150 mg/dL Final   HDL 11/91/478204/08/2022 43  >40 mg/dL Final   Total CHOL/HDL Ratio 07/09/2022 4.0  RATIO Final   VLDL  07/09/2022 29  0 - 40 mg/dL Final   LDL Cholesterol 07/09/2022 98  0 - 99 mg/dL Final   Comment:        Total Cholesterol/HDL:CHD Risk Coronary Heart Disease Risk Table                     Men   Women  1/2 Average Risk   3.4   3.3  Average Risk       5.0   4.4  2 X Average Risk   9.6   7.1  3 X Average Risk  23.4   11.0        Use the calculated Patient Ratio above and the CHD Risk Table to determine the patient's CHD Risk.        ATP III CLASSIFICATION (LDL):  <100     mg/dL   Optimal  [id number]  mg/dL   Near  or Above                    Optimal  130-159  mg/dL   Borderline  191-478  mg/dL   High  >295     mg/dL   Very High Performed at [facility], [address] [place], [place] [address]    TSH 07/09/2022 6.001 (H)  0.350 - 4.500 uIU/mL Final   Comment: Performed by a 3rd Generation assay with a functional sensitivity of <=0.01 uIU/mL. Performed at [facility], [address], [place], [place] [address]    Valproic Acid Lvl 07/09/2022 59  50.0 - 100.0 ug/mL Final   Performed at [facility], [address], [place], [place] [address]    Allergies: Seroquel [quetiapine]  Medications:  Facility Ordered Medications  Medication   acetaminophen (TYLENOL) tablet 650 mg   alum & mag hydroxide-simeth (MAALOX/MYLANTA) 200-200-20 MG/5ML suspension 30 mL   magnesium hydroxide (MILK OF MAGNESIA) suspension 30 mL   hydrOXYzine (ATARAX) tablet 25 mg   metoprolol tartrate (LOPRESSOR) tablet 12.5 mg   levothyroxine (SYNTHROID) tablet 25 mcg   haloperidol (HALDOL) tablet 10 mg   guanFACINE (INTUNIV) ER tablet 4 mg   divalproex (DEPAKOTE) DR tablet 750 mg   diphenhydrAMINE (BENADRYL) capsule 50 mg   cloZAPine (CLOZARIL) tablet 150 mg   cloZAPine (CLOZARIL) tablet 75 mg   PTA Medications  Medication Sig   divalproex (DEPAKOTE) 250 MG DR tablet Take 750 mg by mouth 2 (two) times daily. Take with a 125 mg tablet and a 500 mg tablet for a total dose of 875 mg twice  daily   benzoyl peroxide (DESQUAM-X) 5 % external liquid Apply 1 application topically at bedtime. Apply to buttocks and genitals   fluticasone (FLONASE) 50 MCG/ACT nasal spray Place 1 spray into both nostrils 2 (two) times daily.   haloperidol (HALDOL) 10 MG tablet Take 10 mg by mouth 4 (four) times daily as needed (agitation).   diphenhydrAMINE (BENADRYL) 50 MG capsule Take 50 mg by mouth every 6 (six) hours as needed (agitation).   LORazepam (ATIVAN) 2 MG tablet Take 2 mg by mouth every 8 (eight) hours as needed for anxiety.   metoprolol tartrate (LOPRESSOR) 25 MG tablet Take 25 mg by mouth every morning.   clozapine (CLOZARIL) 50 MG tablet Take 150 mg by mouth at bedtime.   cloZAPine (CLOZARIL) 25 MG tablet Take 75 mg by mouth every morning.   levothyroxine (SYNTHROID) 25 MCG tablet Take 1 tablet (25 mcg total) by mouth daily.    Medical Decision Making  Patient says he is unable to contract for safety at this time; patient will be admitted to [facility] for continuous assessment with follow-up by psychiatry on 07/10/2022.  Resume home medication--home medication list verified with ALF director Stephen Robinson.     Recommendations  Based on my evaluation the patient does not appear to have an emergency medical condition.  Maricela Bo, NP 07/10/22  6:44 AM

## 2022-07-09 NOTE — ED Notes (Signed)
Lab called to add on Valproic Acid Level.

## 2022-07-09 NOTE — ED Notes (Signed)
Pt A&O x 4, from [facility] facility, presents with suicidal ideations, plan to stab self with a knife.  Pt stating he does not want to return to [facility] and threatening to  kill male staff if he has to return.  Pt anxious and cooperative at present.  Monitoring for safety.

## 2022-07-09 NOTE — Progress Notes (Signed)
   07/09/22 1930  [facility] Triage Screening (Walk-ins at [facility] only)  How Did You Hear About [name]? Legal System  What Is the Reason for Your Visit/Call Today? Per medical record, Pt has a diagnosis of IDD and a history of aggressive behavior. He has a legal guardian and lives in [place]. Today Pt became upset due to a conflict with someone at his day program. He states that he left, obtained a knife, and was threatening to stab himself unless someone helped him. He was picked up by law enforcement and brough voluntarily to [facility]. He says he does not want to return to the [facility] and wants to be admitted to [facility] so staff can find another placement. He repeatedly insists that if he is forced to return to [facility] he will kill male staff and he will kill himself by jumping from a window or obtaining something sharp and cut his throat.  How Long Has This Been Causing You Problems? <Week  Have You Recently Had Any Thoughts About Hurting Yourself? Yes  How long ago did you have thoughts about hurting yourself? Current suicidal ideation  Are You Planning to Commit Suicide/Harm Yourself At This time? Yes  Have you Recently Had Thoughts About Hurting Someone [name]? Yes  How long ago did you have thoughts of harming others? Current homicidal ideation  Are You Planning To Harm Someone At This Time? Yes  Explanation: Threatens to kill male staff  Are you currently experiencing any auditory, visual or other hallucinations? No  Have You Used Any Alcohol or Drugs in the Past 24 Hours? No  Do you have any current medical co-morbidities that require immediate attention? No  Clinician description of patient physical appearance/behavior: Pt is casually dressed, alert and oriented x4. Pt speaks in a clear tone, at moderate volume and normal pace. Motor behavior appears normal. Eye contact is good. Pt's mood is anxious and affect is congruent with mood. Thought process is coherent and relevant. There is no indication he is currently responding  to internal stimuli or experiencing delusional thought content. He is cooperative.  What Do You Feel Would Help You the Most Today? Treatment for Depression or other mood problem;Medication(s)  If access to [facility] was not available, would you have sought care in the Emergency Department? Yes  Determination of Need Urgent (48 hours)  Options For Referral [facility];Outpatient Therapy;Medication Management;Inpatient Hospitalization
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
## 2022-07-10 LAB — HEMOGLOBIN A1C
Hgb A1c MFr Bld: 4.9 % (ref 4.8–5.6)
Mean Plasma Glucose: 93.93 mg/dL

## 2022-07-10 LAB — T4, FREE: Free T4: 0.72 ng/dL (ref 0.61–1.12)

## 2022-07-10 MED ORDER — NICOTINE POLACRILEX 2 MG MT GUM
CHEWING_GUM | OROMUCOSAL | Status: AC
  Filled 2022-07-10: qty 1

## 2022-07-10 MED ORDER — NICOTINE POLACRILEX 2 MG MT GUM
2.0000 mg | CHEWING_GUM | Freq: Once | OROMUCOSAL | Status: AC
  Administered 2022-07-10: 2 mg via ORAL

## 2022-07-10 NOTE — ED Notes (Signed)
Pt is awake at this time.  He is eating a roast beef sandwich and given juice.

## 2022-07-10 NOTE — ED Notes (Signed)
Pt sleeping quietly at this time.  Breathing even and unlabored.  Pt is in full view of nursing station.   No distress noted.  

## 2022-07-10 NOTE — ED Notes (Signed)
Pt sleeping@this time. Breathing even and unlabored. Will continue to monitor for safety 

## 2022-07-10 NOTE — ED Notes (Signed)
Pt was given back all his belongings. He was given AVS and escorted to lobby by security.

## 2022-07-10 NOTE — ED Notes (Signed)
Pt is awake and alert. Pt has been making multiple requests to watch tv, food, use the phone, talk to peers,  sit in obs side instead of flex side.   Pt was redirected and questions answered.  Staff will cont to monitor for safety.

## 2022-07-10 NOTE — Discharge Instructions (Addendum)
-  Follow-up with Primary Care provider, thyroid function is slightly below therapeutic range and Levothyroxine dose may require adjustment. TSH level: 6.001 Therapeutic range less than 4.50.  -Safety Plan Stephen Robinson will reach out by calling  [phone], or calling  [phone] or if condition worsens or if suicidal thoughts become active Patients' will follow up with outpatient psychiatric services (therapy/medication management).  The suicide prevention education provided includes the following: Suicide risk factors Suicide prevention and interventions National Suicide Hotline telephone number [facility] assessment telephone number [facility] Emergency Assistance 911 [facility] and/or Residential Mobile Crisis Unit telephone number Request made of family/significant other to:  Dyane Dustman and other Group Home Staff Remove weapons (e.g., guns, rifles, knives), all items previously/currently identified as safety concern.   Remove drugs/medications (over the counter, prescriptions, illicit drugs), all items previously/currently identified as a safety concern.

## 2022-07-10 NOTE — ED Notes (Signed)
Pt given hygiene supplies and towels . Pt encouraged to take a shower. Clean scrubs given
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
## 2022-07-10 NOTE — ED Notes (Signed)
Pt sleeping quietly at this time.  Breathing even and unlabored.  Pt in view of nursing station and being monitored.

## 2022-07-10 NOTE — Progress Notes (Signed)
Brief note: Clozaril [facility]   [facility] 07/09/22=5600  Weekly monitoring.  Thanks Lorenza Evangelist 07/10/2022 12:07 PM

## 2022-07-11 LAB — PROLACTIN: Prolactin: 4.1 ng/mL (ref 3.6–31.5)

## 2022-07-13 LAB — T3, FREE: T3, Free: 3.9 pg/mL (ref 2.0–4.4)

## 2022-08-19 ENCOUNTER — Emergency Department (HOSPITAL_COMMUNITY): Payer: Medicaid Other

## 2022-08-19 ENCOUNTER — Emergency Department (HOSPITAL_COMMUNITY)
Admission: EM | Admit: 2022-08-19 | Discharge: 2022-08-23 | Disposition: A | Discharge status: Home / Self Care | Payer: Medicaid Other | Attending: Emergency Medicine | Admitting: Emergency Medicine

## 2022-08-19 ENCOUNTER — Other Ambulatory Visit: Payer: Self-pay

## 2022-08-19 ENCOUNTER — Encounter (HOSPITAL_COMMUNITY): Payer: Self-pay | Admitting: Emergency Medicine

## 2022-08-19 DIAGNOSIS — R4689 Other symptoms and signs involving appearance and behavior: Secondary | ICD-10-CM | POA: Diagnosis present

## 2022-08-19 DIAGNOSIS — Z79899 Other long term (current) drug therapy: Secondary | ICD-10-CM | POA: Insufficient documentation

## 2022-08-19 DIAGNOSIS — Y92199 Unspecified place in other specified residential institution as the place of occurrence of the external cause: Secondary | ICD-10-CM | POA: Insufficient documentation

## 2022-08-19 DIAGNOSIS — S61411A Laceration without foreign body of right hand, initial encounter: Secondary | ICD-10-CM | POA: Diagnosis not present

## 2022-08-19 DIAGNOSIS — S20312A Abrasion of left front wall of thorax, initial encounter: Secondary | ICD-10-CM | POA: Diagnosis not present

## 2022-08-19 DIAGNOSIS — S93401A Sprain of unspecified ligament of right ankle, initial encounter: Secondary | ICD-10-CM | POA: Diagnosis not present

## 2022-08-19 DIAGNOSIS — S6991XA Unspecified injury of right wrist, hand and finger(s), initial encounter: Secondary | ICD-10-CM | POA: Diagnosis present

## 2022-08-19 DIAGNOSIS — F329 Major depressive disorder, single episode, unspecified: Secondary | ICD-10-CM | POA: Diagnosis not present

## 2022-08-19 DIAGNOSIS — F70 Mild intellectual disabilities: Secondary | ICD-10-CM | POA: Diagnosis not present

## 2022-08-19 DIAGNOSIS — F17210 Nicotine dependence, cigarettes, uncomplicated: Secondary | ICD-10-CM | POA: Insufficient documentation

## 2022-08-19 DIAGNOSIS — R456 Violent behavior: Secondary | ICD-10-CM | POA: Insufficient documentation

## 2022-08-19 DIAGNOSIS — F430 Acute stress reaction: Secondary | ICD-10-CM | POA: Diagnosis not present

## 2022-08-19 LAB — COMPREHENSIVE METABOLIC PANEL
ALT: 16 U/L (ref 0–44)
AST: 26 U/L (ref 15–41)
Albumin: 4 g/dL (ref 3.5–5.0)
Alkaline Phosphatase: 59 U/L (ref 38–126)
Anion gap: 9 (ref 5–15)
BUN: 9 mg/dL (ref 6–20)
CO2: 25 mmol/L (ref 22–32)
Calcium: 9.4 mg/dL (ref 8.9–10.3)
Chloride: 103 mmol/L (ref 98–111)
Creatinine, Ser: 0.73 mg/dL (ref 0.61–1.24)
GFR, Estimated: >60 mL/min (ref >60)
Glucose, Bld: 100 mg/dL — ABNORMAL HIGH (ref 70–99)
Potassium: 3.9 mmol/L (ref 3.5–5.1)
Sodium: 137 mmol/L (ref 135–145)
Total Bilirubin: 0.3 mg/dL (ref 0.3–1.2)
Total Protein: 7.8 g/dL (ref 6.5–8.1)

## 2022-08-19 LAB — TSH: TSH: 6.11 u[IU]/mL — ABNORMAL HIGH (ref 0.350–4.500)

## 2022-08-19 LAB — CBC
HCT: 36.9 % — ABNORMAL LOW (ref 39.0–52.0)
Hemoglobin: 13.3 g/dL (ref 13.0–17.0)
MCH: 31.7 pg (ref 26.0–34.0)
MCHC: 36 g/dL (ref 30.0–36.0)
MCV: 88.1 fL (ref 80.0–100.0)
Platelets: 229 10*3/uL (ref 150–400)
RBC: 4.19 MIL/uL — ABNORMAL LOW (ref 4.22–5.81)
RDW: 12.7 % (ref 11.5–15.5)
WBC: 10.3 10*3/uL (ref 4.0–10.5)
nRBC: 0 % (ref 0.0–0.2)

## 2022-08-19 LAB — ETHANOL: Alcohol, Ethyl (B): <10 mg/dL (ref <10)

## 2022-08-19 LAB — RAPID URINE DRUG SCREEN, HOSP PERFORMED
Amphetamines: NOT DETECTED
Barbiturates: NOT DETECTED
Benzodiazepines: NOT DETECTED
Cocaine: NOT DETECTED
Opiates: NOT DETECTED
Tetrahydrocannabinol: NOT DETECTED

## 2022-08-19 LAB — VALPROIC ACID LEVEL: Valproic Acid Lvl: 81 ug/mL (ref 50.0–100.0)

## 2022-08-19 MED ORDER — GUANFACINE HCL ER 1 MG PO TB24
4.0000 mg | ORAL_TABLET | Freq: Every day | ORAL | Status: DC
  Administered 2022-08-20 – 2022-08-23 (×4): 4 mg via ORAL
  Filled 2022-08-19 (×4): qty 4

## 2022-08-19 MED ORDER — DIVALPROEX SODIUM ER 500 MG PO TB24
750.0000 mg | ORAL_TABLET | ORAL | Status: DC
  Administered 2022-08-20 – 2022-08-23 (×7): 750 mg via ORAL
  Filled 2022-08-19 (×7): qty 1

## 2022-08-19 MED ORDER — CLONAZEPAM 0.5 MG PO TABS
1.0000 mg | ORAL_TABLET | Freq: Two times a day (BID) | ORAL | Status: DC
  Administered 2022-08-20 – 2022-08-23 (×8): 1 mg via ORAL
  Filled 2022-08-19 (×5): qty 1
  Filled 2022-08-19: qty 2
  Filled 2022-08-19 (×2): qty 1

## 2022-08-19 MED ORDER — LEVOTHYROXINE SODIUM 25 MCG PO TABS: 25.0000 ug | ORAL_TABLET | Freq: Every day | ORAL | Status: DC

## 2022-08-19 MED ORDER — DIPHENHYDRAMINE HCL 25 MG PO CAPS: 25.0000 mg | ORAL_CAPSULE | Freq: Four times a day (QID) | ORAL | Status: DC | PRN

## 2022-08-19 MED ORDER — LEVOTHYROXINE SODIUM 25 MCG PO TABS
25.0000 ug | ORAL_TABLET | Freq: Every day | ORAL | Status: DC
  Administered 2022-08-20 – 2022-08-23 (×4): 25 ug via ORAL
  Filled 2022-08-19 (×4): qty 1

## 2022-08-19 MED ORDER — FLUTICASONE PROPIONATE 50 MCG/ACT NA SUSP
1.0000 | Freq: Every day | NASAL | Status: DC
  Administered 2022-08-20 – 2022-08-23 (×4): 1 via NASAL
  Filled 2022-08-19: qty 16

## 2022-08-19 MED ORDER — CLOZAPINE 25 MG PO TABS
75.0000 mg | ORAL_TABLET | Freq: Every morning | ORAL | Status: DC
  Administered 2022-08-20 – 2022-08-23 (×4): 75 mg via ORAL
  Filled 2022-08-19 (×4): qty 3

## 2022-08-19 MED ORDER — METOPROLOL SUCCINATE ER 25 MG PO TB24
12.5000 mg | ORAL_TABLET | Freq: Every day | ORAL | Status: DC
  Administered 2022-08-20 – 2022-08-23 (×4): 12.5 mg via ORAL
  Filled 2022-08-19 (×4): qty 0.5

## 2022-08-19 MED ORDER — DIPHENHYDRAMINE HCL 25 MG PO CAPS
50.0000 mg | ORAL_CAPSULE | Freq: Every day | ORAL | Status: DC
  Administered 2022-08-20 – 2022-08-22 (×4): 50 mg via ORAL
  Filled 2022-08-19 (×4): qty 2
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
## 2022-08-19 NOTE — ED Notes (Signed)
Gave a cola per family request and RN ok

## 2022-08-20 MED ORDER — NICOTINE POLACRILEX 2 MG MT GUM
2.0000 mg | CHEWING_GUM | OROMUCOSAL | Status: DC | PRN
  Administered 2022-08-20 – 2022-08-22 (×8): 2 mg via ORAL
  Filled 2022-08-20 (×9): qty 1

## 2022-08-20 NOTE — Progress Notes (Signed)
Patient has been calm and cooperative this shift.   
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
## 2022-08-20 NOTE — ED Notes (Signed)
Gave pt 2 ham sandwiches and gingerale

## 2022-08-20 NOTE — Progress Notes (Signed)
LCSW Progress Note  [id number]   Ebbie Bourbon  08/20/2022  8:51 PM    Inpatient Behavioral Health Placement  Pt meets inpatient criteria per Alona Bene, PMHNP. There are no available beds within [facility] system per [facility] [employer]. Referral was sent to out of network providers.  Per chart-review it has been recommended that pt be referred for Diversion for approval to refer to [facility]).  Per Psych Provider Alona Bene, PMHNP notes on this date the following: "Patient case review and discussed with Dr. Lucianne Muss. Patient needs inpatient psychiatric admission for stabilization and treatment, due to aggression and danger to others. Spoke with TTS SW about a diversion plan for this patient.    Disposition: Recommend psychiatric Inpatient admission when medically cleared."  Due to pt's DX of IDD this CSW will research and coordinate with pt's LME to obtain pt's IQ score. Due to DX of IDD Diversion Exception Worksheet must be completed along with 5 denials from inpatient behavioral health providers. Pt has been denied at [facility] and [facility] due to no available beds. CSW has sent to out of network providers. This CSW will reach out to pt's LME and [facility] to obtain IQ score and move forward with referral to [facility]).     Destination  Service Provider Address Phone Fax  [facility]  [address] [place] [address] [phone]  [facility] [address]  [address], [place] [address] [phone]  [facility]  [address] [place], [place] [address] [phone]  [facility] [address] [place]  [address] [place] [address] [phone]  [facility] [place] [address] [phone]  [facility]  [address]  [place] [address] [phone]  [facility]  [address] [place], [place] [address] [phone]  [facility]  [address] [place] [address] [phone]  [facility]  [address] [place] [address] [phone]  [facility]  [address] [place] [address] [phone]  [facility]  [address] [place] [address] [phone]  [facility]  [address], [place] [address] [phone]  [facility]  [address] [place] [address] [phone]  [facility]  [address] [place], [place] [address] [phone]  [facility]  [address] [place] [address] [phone]  [facility]  [address] [place] [address] [phone]  [facility]  [address] [place] [address] [phone]  [facility]  [address] [place] [address] [phone]  [facility]  [address], [place] [address] [phone]  [facility]  [address], [place] [address] [phone]  [facility]  [address], [place] [address] [phone]  [facility]  [address] [place] [address] [phone]  [facility]  [address] [place] [address] [phone]  [facility]  [address] [place] [address] [phone]    Situation ongoing,  CSW will follow up.    Maryjean Ka,  MSW, [facility] 08/20/2022 8:51 PM
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
## 2022-08-20 NOTE — ED Notes (Signed)
TTS at bedside. 

## 2022-08-20 NOTE — Progress Notes (Signed)
Orthopedic Tech Progress Note Patient Details:  Stephen Robinson 26-Mar-2001 [id number]  Ortho Devices Type of Ortho Device: ASO Ortho Device/Splint Location: Right ankle Ortho Device/Splint Interventions: Application   Post Interventions Patient Tolerated: Well Instructions Provided: Adjustment of device  Stephen Robinson 08/20/2022, 10:11 AM

## 2022-08-20 NOTE — ED Provider Notes (Signed)
Emergency Medicine Observation Re-evaluation Note  Stephen Robinson is a 22 y.o. male, seen on rounds today.  Pt initially presented to the ED for complaints of Aggressive Behavior and Hand Injury Currently, the patient is pending placement  Physical Exam  BP 125/79 (BP Location: Right Arm)   Pulse 91   Temp 97.6 F (36.4 C) (Oral)   Resp 20   Ht 6' (1.829 m)   Wt 91.6 kg   SpO2 96%   BMI 27.40 kg/m  Physical Exam General: NAD Lungs: No respiratory distress Psych: Calm  ED Course / MDM  EKG:   I have reviewed the labs performed to date as well as medications administered while in observation.  Recent changes in the last 24 hours include no acute changes overnight, patient pending placement in regards to SI, HI, aggressive behaviors.  Plan  Current plan is for placement .    Sloan Leiter, DO 08/20/22 [phone]

## 2022-08-21 LAB — CBC WITH DIFFERENTIAL/PLATELET
Abs Immature Granulocytes: 0.03 10*3/uL (ref 0.00–0.07)
Basophils Absolute: 0.1 10*3/uL (ref 0.0–0.1)
Basophils Relative: 1 %
Eosinophils Absolute: 0.2 10*3/uL (ref 0.0–0.5)
Eosinophils Relative: 3 %
HCT: 36.8 % — ABNORMAL LOW (ref 39.0–52.0)
Hemoglobin: 13.1 g/dL (ref 13.0–17.0)
Immature Granulocytes: 0 %
Lymphocytes Relative: 44 %
Lymphs Abs: 3.9 10*3/uL (ref 0.7–4.0)
MCH: 31.6 pg (ref 26.0–34.0)
MCHC: 35.6 g/dL (ref 30.0–36.0)
MCV: 88.9 fL (ref 80.0–100.0)
Monocytes Absolute: 1.1 10*3/uL — ABNORMAL HIGH (ref 0.1–1.0)
Monocytes Relative: 12 %
Neutro Abs: 3.5 10*3/uL (ref 1.7–7.7)
Neutrophils Relative %: 40 %
Platelets: 231 10*3/uL (ref 150–400)
RBC: 4.14 MIL/uL — ABNORMAL LOW (ref 4.22–5.81)
RDW: 12.4 % (ref 11.5–15.5)
WBC: 8.8 10*3/uL (ref 4.0–10.5)
nRBC: 0 % (ref 0.0–0.2)

## 2022-08-21 NOTE — Progress Notes (Cosign Needed Addendum)
[facility] ED Progress Note  08/21/2022 3:05 PM Stephen Robinson  MRN:  [id number]   Principal Problem: Aggressive behavior of adult Diagnosis:  Principal Problem:   Aggressive behavior of adult Active Problems:   Mild intellectual disability   ED Assessment Time Calculation: Start Time: 1045 Stop Time: 1105 Total Time in Minutes (Assessment Completion): 20    Subjective:  On evaluation today, the patient is laying in his bed, no distress noted. She is calm and cooperative during this assessment. His appearance is appropriate for environment. His eye contact is fair. Speech is clear and coherent, normal pace and normal volume.He is alert and oriented x4 to person, place, time, and situation. Patient mood is euthymic, affect congruent with mood. Thought process coherent and linear.  Thought content logical and within normal limits.  Memory, judgment, and insight poor.  Patient refused to contract for safety.  Patient still hyperfocused on going to [facility], states he remembers being very good in the group home, says he has no independence.  Provider asked patient what does independence mean to him and he stated "to do what I do" says that means he has no rules and if he wanted to pick up cigarettes off the ground and some them he could, he states that he does not like people telling him what to do.  Provider discussed with him that [facility], he will not have a lot of independence and there will be staff members there also with rules, he stated he did not care if he still wants to go to [facility]. Patient denies SI/HI/AVH, continues to endorse wanting to hurt staff members at his group home, because they would not let him do what he wants to.  Patient understands that he has been at the group home the longest of his life and knows that overall they (staff) are in place for his safety. No indication that she is responding to internal stimuli during this assessment.  No delusions  elicited during this assessment.  He denies suicidal ideations. He denies homicidal ideations. Appetite and sleep are fair.  Patient refuses to contract for safety upon going back to the group home.   Past Psychiatric History: History of ADHD, ODD, OCD, admission to [facility]-2022, admission to [facility], d/c'd 11/7, admission to [facility] June 2021.    [place] Scale:  Flowsheet Row ED from 08/19/2022 in [facility] Emergency Department at [facility] ED from 07/09/2022 in [facility] ED from 02/18/2021 in [facility] Emergency Department at [facility]  C-SSRS RISK CATEGORY No Risk High Risk High Risk       Past Medical History:  Past Medical History:  Diagnosis Date   ADHD    OCD (obsessive compulsive disorder)    History reviewed. No pertinent surgical history. Family History: History reviewed. No pertinent family history.   Social History:  Social History   Substance and Sexual Activity  Alcohol Use Yes     Social History   Substance and Sexual Activity  Drug Use Yes   Types: Marijuana    Social History   Socioeconomic History   Marital status: Single    Spouse name: Not on file   Number of children: Not on file   Years of education: Not on file   Highest education level: Not on file  Occupational History   Not on file  Tobacco Use   Smoking status: Every Day    Types: Cigarettes   Smokeless  tobacco: Never  Vaping Use   Vaping Use: Every day  Substance and Sexual Activity   Alcohol use: Yes   Drug use: Yes    Types: Marijuana   Sexual activity: Not on file  Other Topics Concern   Not on file  Social History Narrative   Not on file   Social Determinants of Health   Financial Resource Strain: Not on file  Food Insecurity: Food Insecurity Present (07/09/2022)   Hunger Vital Sign    Worried About Running Out of Food in the Last Year: Sometimes true    Ran Out of Food in the Last Year: Not on  file  Transportation Needs: No Transportation Needs (07/09/2022)   PRAPARE - Administrator, Civil Service (Medical): No    Lack of Transportation (Non-Medical): No  Physical Activity: Not on file  Stress: Not on file  Social Connections: Not on file    Sleep: Fair  Appetite:  Good  Current Medications: Current Facility-Administered Medications  Medication Dose Route Frequency Provider Last Rate Last Admin   clonazePAM (KLONOPIN) tablet 1 mg  1 mg Oral BID Benjiman Core, MD   1 mg at 08/21/22 0929   cloZAPine (CLOZARIL) tablet 75 mg  75 mg Oral q morning Benjiman Core, MD   75 mg at 08/21/22 0932   diphenhydrAMINE (BENADRYL) capsule 25-50 mg  25-50 mg Oral Q6H PRN Benjiman Core, MD       diphenhydrAMINE (BENADRYL) capsule 50 mg  50 mg Oral QHS Benjiman Core, MD   50 mg at 08/20/22 2153   divalproex (DEPAKOTE ER) 24 hr tablet 750 mg  750 mg Oral Mattie Marlin, MD   750 mg at 08/21/22 0719   fluticasone (FLONASE) 50 MCG/ACT nasal spray 1 spray  1 spray Each Nare Daily Benjiman Core, MD   1 spray at 08/21/22 0931   guanFACINE (INTUNIV) ER tablet 4 mg  4 mg Oral Daily Benjiman Core, MD   4 mg at 08/21/22 0931   levothyroxine (SYNTHROID) tablet 25 mcg  25 mcg Oral Q0600 Benjiman Core, MD   25 mcg at 08/21/22 0715   metoprolol succinate (TOPROL-XL) 24 hr tablet 12.5 mg  12.5 mg Oral Daily Benjiman Core, MD   12.5 mg at 08/21/22 9147   nicotine polacrilex (NICORETTE) gum 2 mg  2 mg Oral PRN Tanda Rockers A, DO   2 mg at 08/21/22 8295   Current Outpatient Medications  Medication Sig Dispense Refill   cholecalciferol (VITAMIN D3) 25 MCG (1000 UNIT) tablet Take 1,000 Units by mouth daily.     clonazePAM (KLONOPIN) 1 MG tablet Take 1 mg by mouth 2 (two) times daily.     cloZAPine (CLOZARIL) 25 MG tablet Take 75 mg by mouth every morning.     clozapine (CLOZARIL) 50 MG tablet Take 150 mg by mouth at bedtime.     diphenhydrAMINE (BENADRYL) 25  MG tablet Take 25-50 mg by mouth every 6 (six) hours as needed for allergies (and agitation).     diphenhydrAMINE (BENADRYL) 50 MG capsule Take 50 mg by mouth at bedtime.     divalproex (DEPAKOTE ER) 250 MG 24 hr tablet Take 250 mg by mouth in the morning and at bedtime. Takes with the 500 mg tablet = 750 mg     divalproex (DEPAKOTE ER) 500 MG 24 hr tablet Take 500 mg by mouth in the morning and at bedtime. Pt takes with 250 for a total of 750mg      fluticasone (  FLONASE) 50 MCG/ACT nasal spray Place 1 spray into both nostrils daily.     guanFACINE (INTUNIV) 4 MG TB24 ER tablet Take 1 tablet (4 mg total) by mouth at bedtime. (Patient taking differently: Take 4 mg by mouth in the morning.) 30 tablet 2   ipratropium (ATROVENT) 0.06 % nasal spray Place 2 sprays into both nostrils 2 (two) times daily as needed (Pt uses for drooling (SL)). Pt uses for drooling (SL)     levothyroxine (SYNTHROID) 25 MCG tablet Take 1 tablet (25 mcg total) by mouth daily.     metoprolol succinate (TOPROL-XL) 25 MG 24 hr tablet Take 12.5 mg by mouth daily.     polyethylene glycol (MIRALAX / GLYCOLAX) 17 g packet Take 17 g by mouth 2 (two) times daily.     senna (SENOKOT) 8.6 MG TABS tablet Take 2 tablets by mouth in the morning and at bedtime.     sodium chloride (OCEAN) 0.65 % SOLN nasal spray Place 1 spray into both nostrils as needed for congestion.     haloperidol (HALDOL) 10 MG tablet Take 10 mg by mouth 4 (four) times daily as needed (agitation).     LORazepam (ATIVAN) 2 MG tablet Take 4 mg by mouth every 6 (six) hours as needed for anxiety.     OLANZapine (ZYPREXA) 5 MG tablet Take 5 mg by mouth 2 (two) times daily as needed (agitation).      Lab Results:  Results for orders placed or performed during the hospital encounter of 08/19/22 (from the past 48 hour(s))  Urine rapid drug screen (hosp performed)     Status: None   Collection Time: 08/19/22  9:45 PM  Result Value Ref Range   Opiates NONE DETECTED NONE  DETECTED   Cocaine NONE DETECTED NONE DETECTED   Benzodiazepines NONE DETECTED NONE DETECTED   Amphetamines NONE DETECTED NONE DETECTED   Tetrahydrocannabinol NONE DETECTED NONE DETECTED   Barbiturates NONE DETECTED NONE DETECTED    Comment: (NOTE) DRUG SCREEN FOR MEDICAL PURPOSES ONLY.  IF CONFIRMATION IS NEEDED FOR ANY PURPOSE, NOTIFY LAB WITHIN 5 DAYS.  LOWEST DETECTABLE LIMITS FOR URINE DRUG SCREEN Drug Class                     Cutoff (ng/mL) Amphetamine and metabolites    1000 Barbiturate and metabolites    200 Benzodiazepine                 200 Opiates and metabolites        300 Cocaine and metabolites        300 THC                            50 Performed at [facility], [address], [place], [place] [address]   Comprehensive metabolic panel     Status: Abnormal   Collection Time: 08/19/22 10:00 PM  Result Value Ref Range   Sodium 137 135 - 145 mmol/L   Potassium 3.9 3.5 - 5.1 mmol/L   Chloride 103 98 - 111 mmol/L   CO2 25 22 - 32 mmol/L   Glucose, Bld 100 (H) 70 - 99 mg/dL    Comment: Glucose reference range applies only to samples taken after fasting for at least 8 hours.   BUN 9 6 - 20 mg/dL   Creatinine, Ser 6.04 0.61 - 1.24 mg/dL   Calcium 9.4 8.9 - 54.0 mg/dL   Total Protein  7.8 6.5 - 8.1 g/dL   Albumin 4.0 3.5 - 5.0 g/dL   AST 26 15 - 41 U/L   ALT 16 0 - 44 U/L   Alkaline Phosphatase 59 38 - 126 U/L   Total Bilirubin 0.3 0.3 - 1.2 mg/dL   GFR, Estimated >16 >10 mL/min    Comment: (NOTE) Calculated using the CKD-EPI Creatinine Equation (2021)    Anion gap 9 5 - 15    Comment: Performed at [facility], [address] [place], [place] [address]  TSH     Status: Abnormal   Collection Time: 08/19/22 10:00 PM  Result Value Ref Range   TSH 6.110 (H) 0.350 - 4.500 uIU/mL    Comment: Performed by a 3rd Generation assay with a functional sensitivity of <=0.01 uIU/mL. Performed at [facility], [address] [place], [place] [address]   Valproic acid level     Status: None   Collection Time: 08/19/22 10:00 PM  Result Value Ref Range   Valproic Acid Lvl 81 50.0 - 100.0 ug/mL    Comment: Performed at [facility], [address], [place], [place] [address]  CBC     Status: Abnormal   Collection Time: 08/19/22 10:00 PM  Result Value Ref Range   WBC 10.3 4.0 - 10.5 K/uL   RBC 4.19 (L) 4.22 - 5.81 MIL/uL   Hemoglobin 13.3 13.0 - 17.0 g/dL   HCT 82.9 (L) 56.2 - 13.0 %   MCV 88.1 80.0 - 100.0 fL   MCH 31.7 26.0 - 34.0 pg   MCHC 36.0 30.0 - 36.0 g/dL   RDW 86.5 78.4 - 69.6 %   Platelets 229 150 - 400 K/uL   nRBC 0.0 0.0 - 0.2 %    Comment: Performed at [facility], [address] [place], [place] [address]  Ethanol     Status: None   Collection Time: 08/19/22 10:00 PM  Result Value Ref Range   Alcohol, Ethyl (B) <10 <10 mg/dL    Comment: (NOTE) Lowest detectable limit for serum alcohol is 10 mg/dL.  For medical purposes only. Performed at [facility], [address] [place], [place] [address]   CBC with Differential/Platelet     Status: Abnormal   Collection Time: 08/21/22  6:39 AM  Result Value Ref Range   WBC 8.8 4.0 - 10.5 K/uL   RBC 4.14 (L) 4.22 - 5.81 MIL/uL   Hemoglobin 13.1 13.0 - 17.0 g/dL   HCT 40.1 (L) 02.7 - 25.3 %   MCV 88.9 80.0 - 100.0 fL   MCH 31.6 26.0 - 34.0 pg   MCHC 35.6 30.0 - 36.0 g/dL   RDW 66.4 40.3 - 47.4 %   Platelets 231 150 - 400 K/uL   nRBC 0.0 0.0 - 0.2 %   Neutrophils Relative % 40 %   Neutro Abs 3.5 1.7 - 7.7 K/uL   Lymphocytes Relative 44 %   Lymphs Abs 3.9 0.7 - 4.0 K/uL   Monocytes Relative 12 %   Monocytes Absolute 1.1 (H) 0.1 - 1.0 K/uL   Eosinophils Relative 3 %   Eosinophils Absolute 0.2 0.0 - 0.5 K/uL   Basophils Relative 1 %   Basophils Absolute 0.1 0.0 - 0.1 K/uL   Immature Granulocytes 0 %   Abs Immature Granulocytes 0.03 0.00 - 0.07 K/uL    Comment:  Performed at [facility], [address], [place], [place] [address]  Blood Alcohol level:  Lab Results  Component Value Date   ETH <10 08/19/2022   ETH <10 07/09/2022    Physical Findings:  CIWA:    COWS:     Musculoskeletal: Strength & Muscle Tone: within normal limits Gait & Station: normal Patient leans: N/A  Psychiatric Specialty Exam:  Presentation  General Appearance:  Appropriate for Environment  Eye Contact: Good  Speech: Clear and Coherent  Speech Volume: Normal  Handedness: Right   Mood and Affect  Mood: Euthymic  Affect: Appropriate   Thought Process  Thought Processes: Coherent  Descriptions of Associations:Intact  Orientation:Full (Time, Place and Person)  Thought Content:Logical  History of Schizophrenia/Schizoaffective disorder:No  Duration of Psychotic Symptoms:No data recorded Hallucinations:Hallucinations: None  Ideas of Reference:None  Suicidal Thoughts:Suicidal Thoughts: No  Homicidal Thoughts:Homicidal Thoughts: No   Sensorium  Memory: Immediate Fair; Recent Fair  Judgment: Poor  Insight: Poor   Executive Functions  Concentration: Fair  Attention Span: Fair  Recall: [employer] of Knowledge: Fair  Language: Fair   Psychomotor Activity  Psychomotor Activity: Psychomotor Activity: Normal   Assets  Assets: Communication Skills; Social Support   Sleep  Sleep: Sleep: Fair    Physical Exam: Physical Exam Cardiovascular:     Rate and Rhythm: Normal rate.  Musculoskeletal:        General: Normal range of motion.     Cervical back: Normal range of motion.  Neurological:     Mental Status: He is alert.  Psychiatric:        Attention and Perception: Attention normal.        Mood and Affect: Mood normal.        Speech: Speech normal.        Behavior: Behavior is cooperative.        Thought Content: Thought content normal.        Cognition and Memory: Memory  normal.        Judgment: Judgment is impulsive and inappropriate.    Review of Systems  Constitutional: Negative.   Respiratory: Negative.    Musculoskeletal: Negative.   Psychiatric/Behavioral:         Hx of physical aggression towards others   Blood pressure 125/74, pulse 77, temperature 97.7 F (36.5 C), temperature source Oral, resp. rate 20, height 6' (1.829 m), weight 91.6 kg, SpO2 100 %. Body mass index is 27.4 kg/m.   Medical Decision Making: Patient continues to require inpatient Psychiatric hospitalization, patient legal guardian fears for his safety and feels that he is due to a danger to others. Patient has been compliant with medications while here in hospital. Patient has been faxed out to various inpatient facilities by SW TTS.    Darilyn Storbeck MOTLEY-MANGRUM, PMHNP 08/21/2022, 3:05 PM

## 2022-08-21 NOTE — ED Notes (Signed)
Patient reporting that he is hungry although he has had multiple snacks.

## 2022-08-21 NOTE — Progress Notes (Addendum)
This CSW called [facility] and spoke with crisis clinician Renae Fickle [phone]. Renae Fickle advised that there is not a listed IQ score in pt's chart however pt has been assigned to a care coordinator Maxie Better.   Renae Fickle provided this CSW the after hours fax number to send Diversion Exception worksheet for request to refer to [facility]). CSW sent Division Exception and [facility] referral with supporting clinical documentation via fax [phone] or [phone]. This CSW will follow up in the morning with LME on Diversion referral status.   Maryjean Ka, MSW, [facility] 08/21/2022 5:51 PM

## 2022-08-21 NOTE — ED Notes (Signed)
[facility] case # [id number]

## 2022-08-21 NOTE — ED Notes (Signed)
Patient remain asleep at this time. ?

## 2022-08-22 DIAGNOSIS — R4689 Other symptoms and signs involving appearance and behavior: Secondary | ICD-10-CM

## 2022-08-22 DIAGNOSIS — F430 Acute stress reaction: Secondary | ICD-10-CM

## 2022-08-22 DIAGNOSIS — F70 Mild intellectual disabilities: Secondary | ICD-10-CM

## 2022-08-22 MED ORDER — ZIPRASIDONE MESYLATE 20 MG IM SOLR
20.0000 mg | INTRAMUSCULAR | Status: AC | PRN
  Administered 2022-08-22: 20 mg via INTRAMUSCULAR
  Filled 2022-08-22: qty 20

## 2022-08-22 MED ORDER — LORAZEPAM 1 MG PO TABS: 1.0000 mg | ORAL_TABLET | ORAL | Status: DC | PRN

## 2022-08-22 MED ORDER — OLANZAPINE 5 MG PO TBDP: 5.0000 mg | ORAL_TABLET | Freq: Three times a day (TID) | ORAL | Status: DC | PRN

## 2022-08-22 MED ORDER — NICOTINE 21 MG/24HR TD PT24
21.0000 mg | MEDICATED_PATCH | Freq: Every day | TRANSDERMAL | Status: DC
  Filled 2022-08-22: qty 1

## 2022-08-22 NOTE — ED Notes (Signed)
Patient resting all night, calm and cooperative.

## 2022-08-22 NOTE — ED Notes (Signed)
Pt threatening staff, attempting to destroy parts of room in TCU. [employer] speaking with pt, pt not following instruction. Continues with verbal threats towards [employer] and staff

## 2022-08-22 NOTE — Progress Notes (Signed)
This CSW has spoke with Renae Fickle with [facility] pt's LME Behavioral Health Crisis: [phone] who informed that pt's Diversion Exception, [facility] Referral is under review and will call CSW back with follow up.  CSW sent information to [facility] so that they would have referral due to this CSW's shift is ending. CSW is awaiting an approval number for Diversion which is provided by the LME.    Maryjean Ka, MSW, LCSWA 08/22/2022 7:06 PM

## 2022-08-22 NOTE — Progress Notes (Addendum)
[facility] ED Progress Note  08/22/2022 4:23 PM Stephen Robinson  MRN:  [id number]   Principal Problem: Aggressive behavior of adult Diagnosis:  Principal Problem:   Aggressive behavior of adult Active Problems:   Mild intellectual disability   ED Assessment Time Calculation: Start Time: 1100 Stop Time: 1115 Total Time in Minutes (Assessment Completion): 15   Subjective:   On evaluation today, the patient is in his room, making art crafts with paper, no distress noted. He is calm and cooperative during this assessment. His appearance is appropriate for environment. His eye contact is good. Speech is clear and coherent, normal pace and normal volume.He is alert and oriented x4 to person, place, time, and situation. Patient mood is euthymic, affect congruent with mood. Thought process coherent and linear.  Thought content logical and within normal limits.  Memory, judgment, and insight poor.   Patient appears to be in a better mood today, as he is happy and smiling, pleasant and cooperative during assessment.  Patient is able to contract for safety.  Patient denies SI/HI/AVH.  Patient states that he is bored here and he is ready to go back to the group home, he states that he understands what he did was wrong, by being aggressive to staff, that he has been talking to staff while here in the emergency department and he feels better. No indication that he is responding to internal stimuli during this assessment. No delusions elicited during this assessment.  He denies suicidal ideations. He denies homicidal ideations. Appetite and sleep are good.     Past Psychiatric History: History of ADHD, ODD, OCD, admission to [facility]-2022, admission to [facility], d/c'd 11/7, admission to [facility] June 2021.     [place] Scale:  Flowsheet Row ED from 08/19/2022 in [facility] Emergency Department at [facility] ED from 07/09/2022 in [facility] ED  from 02/18/2021 in [facility] Emergency Department at [facility]  C-SSRS RISK CATEGORY No Risk High Risk High Risk       Past Medical History:  Past Medical History:  Diagnosis Date   ADHD    OCD (obsessive compulsive disorder)    History reviewed. No pertinent surgical history. Family History: History reviewed. No pertinent family history.  Social History:  Social History   Substance and Sexual Activity  Alcohol Use Yes     Social History   Substance and Sexual Activity  Drug Use Yes   Types: Marijuana    Social History   Socioeconomic History   Marital status: Single    Spouse name: Not on file   Number of children: Not on file   Years of education: Not on file   Highest education level: Not on file  Occupational History   Not on file  Tobacco Use   Smoking status: Every Day    Types: Cigarettes   Smokeless tobacco: Never  Vaping Use   Vaping Use: Every day  Substance and Sexual Activity   Alcohol use: Yes   Drug use: Yes    Types: Marijuana   Sexual activity: Not on file  Other Topics Concern   Not on file  Social History Narrative   Not on file   Social Determinants of Health   Financial Resource Strain: Not on file  Food Insecurity: Food Insecurity Present (07/09/2022)   Hunger Vital Sign    Worried About Running Out of Food in the Last Year: Sometimes true    Ran Out of Food  in the Last Year: Not on file  Transportation Needs: No Transportation Needs (07/09/2022)   PRAPARE - Administrator, Civil Service (Medical): No    Lack of Transportation (Non-Medical): No  Physical Activity: Not on file  Stress: Not on file  Social Connections: Not on file    Sleep: Good  Appetite:  Good  Current Medications: Current Facility-Administered Medications  Medication Dose Route Frequency Provider Last Rate Last Admin   clonazePAM (KLONOPIN) tablet 1 mg  1 mg Oral BID Benjiman Core, MD   1 mg at 08/22/22 0900   cloZAPine (CLOZARIL)  tablet 75 mg  75 mg Oral q morning Benjiman Core, MD   75 mg at 08/22/22 1053   diphenhydrAMINE (BENADRYL) capsule 25-50 mg  25-50 mg Oral Q6H PRN Benjiman Core, MD       diphenhydrAMINE (BENADRYL) capsule 50 mg  50 mg Oral QHS Benjiman Core, MD   50 mg at 08/21/22 2148   divalproex (DEPAKOTE ER) 24 hr tablet 750 mg  750 mg Oral Mattie Marlin, MD   750 mg at 08/22/22 0851   fluticasone (FLONASE) 50 MCG/ACT nasal spray 1 spray  1 spray Each Nare Daily Benjiman Core, MD   1 spray at 08/22/22 0910   guanFACINE (INTUNIV) ER tablet 4 mg  4 mg Oral Daily Benjiman Core, MD   4 mg at 08/22/22 1053   levothyroxine (SYNTHROID) tablet 25 mcg  25 mcg Oral Q0600 Benjiman Core, MD   25 mcg at 08/22/22 0659   OLANZapine zydis (ZYPREXA) disintegrating tablet 5 mg  5 mg Oral Q8H PRN Motley-Mangrum, Persais Ethridge A, PMHNP       And   LORazepam (ATIVAN) tablet 1 mg  1 mg Oral PRN Motley-Mangrum, Shaney Deckman A, PMHNP       metoprolol succinate (TOPROL-XL) 24 hr tablet 12.5 mg  12.5 mg Oral Daily Benjiman Core, MD   12.5 mg at 08/22/22 1054   nicotine polacrilex (NICORETTE) gum 2 mg  2 mg Oral PRN Tanda Rockers A, DO   2 mg at 08/22/22 0909   Current Outpatient Medications  Medication Sig Dispense Refill   cholecalciferol (VITAMIN D3) 25 MCG (1000 UNIT) tablet Take 1,000 Units by mouth daily.     clonazePAM (KLONOPIN) 1 MG tablet Take 1 mg by mouth 2 (two) times daily.     cloZAPine (CLOZARIL) 25 MG tablet Take 75 mg by mouth every morning.     clozapine (CLOZARIL) 50 MG tablet Take 150 mg by mouth at bedtime.     diphenhydrAMINE (BENADRYL) 25 MG tablet Take 25-50 mg by mouth every 6 (six) hours as needed for allergies (and agitation).     diphenhydrAMINE (BENADRYL) 50 MG capsule Take 50 mg by mouth at bedtime.     divalproex (DEPAKOTE ER) 250 MG 24 hr tablet Take 250 mg by mouth in the morning and at bedtime. Takes with the 500 mg tablet = 750 mg     divalproex (DEPAKOTE ER) 500 MG 24  hr tablet Take 500 mg by mouth in the morning and at bedtime. Pt takes with 250 for a total of 750mg      fluticasone (FLONASE) 50 MCG/ACT nasal spray Place 1 spray into both nostrils daily.     guanFACINE (INTUNIV) 4 MG TB24 ER tablet Take 1 tablet (4 mg total) by mouth at bedtime. (Patient taking differently: Take 4 mg by mouth in the morning.) 30 tablet 2   ipratropium (ATROVENT) 0.06 % nasal spray Place 2 sprays  into both nostrils 2 (two) times daily as needed (Pt uses for drooling (SL)). Pt uses for drooling (SL)     levothyroxine (SYNTHROID) 25 MCG tablet Take 1 tablet (25 mcg total) by mouth daily.     metoprolol succinate (TOPROL-XL) 25 MG 24 hr tablet Take 12.5 mg by mouth daily.     polyethylene glycol (MIRALAX / GLYCOLAX) 17 g packet Take 17 g by mouth 2 (two) times daily.     senna (SENOKOT) 8.6 MG TABS tablet Take 2 tablets by mouth in the morning and at bedtime.     sodium chloride (OCEAN) 0.65 % SOLN nasal spray Place 1 spray into both nostrils as needed for congestion.     haloperidol (HALDOL) 10 MG tablet Take 10 mg by mouth 4 (four) times daily as needed (agitation).     LORazepam (ATIVAN) 2 MG tablet Take 4 mg by mouth every 6 (six) hours as needed for anxiety.     OLANZapine (ZYPREXA) 5 MG tablet Take 5 mg by mouth 2 (two) times daily as needed (agitation).      Lab Results:  Results for orders placed or performed during the hospital encounter of 08/19/22 (from the past 48 hour(s))  CBC with Differential/Platelet     Status: Abnormal   Collection Time: 08/21/22  6:39 AM  Result Value Ref Range   WBC 8.8 4.0 - 10.5 K/uL   RBC 4.14 (L) 4.22 - 5.81 MIL/uL   Hemoglobin 13.1 13.0 - 17.0 g/dL   HCT 09.8 (L) 11.9 - 14.7 %   MCV 88.9 80.0 - 100.0 fL   MCH 31.6 26.0 - 34.0 pg   MCHC 35.6 30.0 - 36.0 g/dL   RDW 82.9 56.2 - 13.0 %   Platelets 231 150 - 400 K/uL   nRBC 0.0 0.0 - 0.2 %   Neutrophils Relative % 40 %   Neutro Abs 3.5 1.7 - 7.7 K/uL   Lymphocytes Relative 44 %    Lymphs Abs 3.9 0.7 - 4.0 K/uL   Monocytes Relative 12 %   Monocytes Absolute 1.1 (H) 0.1 - 1.0 K/uL   Eosinophils Relative 3 %   Eosinophils Absolute 0.2 0.0 - 0.5 K/uL   Basophils Relative 1 %   Basophils Absolute 0.1 0.0 - 0.1 K/uL   Immature Granulocytes 0 %   Abs Immature Granulocytes 0.03 0.00 - 0.07 K/uL    Comment: Performed at [facility], [address] [place], [place] [address]    Blood Alcohol level:  Lab Results  Component Value Date   [facility] <10 08/19/2022   ETH <10 07/09/2022    Physical Findings:  CIWA:    COWS:     Musculoskeletal: Strength & Muscle Tone: within normal limits Gait & Station: normal Patient leans: N/A  Psychiatric Specialty Exam:  Presentation  General Appearance:  Appropriate for Environment  Eye Contact: Good  Speech: Clear and Coherent  Speech Volume: Normal  Handedness: Right   Mood and Affect  Mood: Euthymic  Affect: Appropriate   Thought Process  Thought Processes: Coherent  Descriptions of Associations:Intact  Orientation:Full (Time, Place and Person)  Thought Content:Logical  History of Schizophrenia/Schizoaffective disorder:No  Duration of Psychotic Symptoms:No data recorded Hallucinations:Hallucinations: None  Ideas of Reference:None  Suicidal Thoughts:Suicidal Thoughts: No  Homicidal Thoughts:Homicidal Thoughts: No   Sensorium  Memory: Immediate Fair; Recent Fair  Judgment: Poor  Insight: Poor   Executive Functions  Concentration: Fair  Attention Span: Fair  Recall: [employer] of Knowledge: Fair  Language: Fair   Psychomotor Activity  Psychomotor Activity: Psychomotor Activity: Normal   Assets  Assets: Communication Skills; Social Support   Sleep  Sleep: Sleep: Fair    Physical Exam: Physical Exam Vitals and nursing note reviewed. Exam conducted with a chaperone present.  Musculoskeletal:        General: Normal range of motion.      Cervical back: Normal range of motion.  Neurological:     Mental Status: He is alert.  Psychiatric:        Attention and Perception: Attention normal.        Mood and Affect: Mood normal.        Speech: Speech normal.        Behavior: Behavior is agitated.        Thought Content: Thought content normal.        Cognition and Memory: Memory normal.        Judgment: Judgment is impulsive.    Review of Systems  Constitutional: Negative.   Musculoskeletal: Negative.   Psychiatric/Behavioral:         Patient demonstrates aggression and impulsivity.    Blood pressure 120/76, pulse 67, temperature 98 F (36.7 C), temperature source Oral, resp. rate 19, height 6' (1.829 m), weight 91.6 kg, SpO2 99 %. Body mass index is 27.4 kg/m.   Medical Decision Making: Patient continues to require inpatient Psychiatric hospitalization due to aggression. @1412  per RN patient became very agitated due to the constant request for snacks being denied, patient began making verbal threats toward staff.  Patient given IM Geodon 20 mg at 1438.  Attempted to call patient's legal guardian [name], left HIPAA compliant message.  08/21/22 TTS SW Note: Renae Fickle provided this CSW the after hours fax number to send Diversion Exception worksheet for request to refer to [facility] CSW sent Division Exception and [facility] referral with supporting clinical documentation via fax [phone] or [phone]. This CSW will follow up in the morning with LME on Diversion referral status.   Stephen Robinson MOTLEY-MANGRUM, PMHNP 08/22/2022, 4:23 PM

## 2022-08-22 NOTE — ED Notes (Signed)
Pt is getting very agitated due to the constant request for snacks being denied. Pt is making verbal threats towards staff.

## 2022-08-22 NOTE — ED Provider Notes (Signed)
Emergency Medicine Observation Re-evaluation Note  Stephen Robinson is a 22 y.o. male, seen on rounds today.  Pt initially presented to the ED for complaints of Aggressive Behavior and Hand Injury Currently, the patient is awaiting psych placement.  Physical Exam  BP 120/76 (BP Location: Left Arm)   Pulse 67   Temp 98 F (36.7 C) (Oral)   Resp 19   Ht 6' (1.829 m)   Wt 91.6 kg   SpO2 99%   BMI 27.40 kg/m  Physical Exam Awake and in no acute distress  ED Course / MDM  EKG:   I have reviewed the labs performed to date as well as medications administered while in observation.  Recent changes in the last 24 hours include none.  Plan  Current plan is for a splint for aggressive behavior.    Bethann Berkshire, MD 08/22/22 [phone]

## 2022-08-23 MED ORDER — HALOPERIDOL 5 MG PO TABS
10.0000 mg | ORAL_TABLET | Freq: Once | ORAL | Status: AC
  Administered 2022-08-23: 10 mg via ORAL
  Filled 2022-08-23: qty 2

## 2022-08-23 NOTE — ED Notes (Signed)
Pt is rolling around restlessly in bed at this time. Pt walked back to his room after being told he couldn't have a snack due to him eating most of the night. PT requested remote and turned off his TV.

## 2022-08-23 NOTE — Progress Notes (Signed)
This CSW called [employer] back and spoke with crisis clinician via phone. CSW was informed that the Diversion was APPROVED.  Crisis Clinician informed that Koren Shiver is continuing to have issues with call [facility] fax numbers to receive the approval form back. This CSW has requested that [facility] send the approval/ authorization form via secure [facility] email. CSW provided this CSW's [employer] email and is awaiting phone call back from [place].    Maryjean Ka, MSW, [facility] 08/23/2022 7:53 AM

## 2022-08-23 NOTE — Discharge Instructions (Signed)
Mental Health Resources National Suicide Prevention Lifeline 1-800-273-TALK (8255) http://www.suicidepreventionlifeline.org/ "988"-Mental Health Crisis Line  National Schizophrenia Foundation https://sczaction.org/ National Institute of Mental Health 1-866-615-6464 nimhinfo@nih.gov (e-mail) www.nimh.nih.gov Schizophrenia & Psychosis Action Alliance 800-493-2094 info@sczaction.org https://sczaction.org/  5 Additional Healthline identified "Best online Schizophrenia Support Groups" Students with Psychosis  Schizophrenia Spectrum Support  Supportiv ($30 monthly fee)   NAMI Connection Recovery Support Group  Schizophrenia Alliance  Local Resources: Outpatient:   [facility]:  ([phone] [address]   [place], [place]  [address]     https://www.[place].gov/services/[place]-behavioral-health-centers#contact   [place]  [facility] [place] [address] [place], [place] [address] [phone]  [place]  [facility] [place] [address] [place], [place]  [address] [phone] Local Resources (Inpatient)  [facility] [address]  [place], [place] [address] [phone]  [facility] Behavioral Medicine Unit and Geriatric Psychiatric Unit   [address]  [place], [place] [address] [phone]   NAMI -[facility] https://naminwpiedmontnc.org/support-and-education/mental-health-education/  NAMI -[place] https://namigu[place]ford.org/  Community Housing and Support Resources:  [facility] Ending Homelessness https://pehgc.org/  [facility] Resource Center https://www.interactiveresourcecenter.org/  [place] [facility] https://www.greensborourbanministry.org/  [facility] [place] (adult men's shelter) www.opendoorministrieshp.org [address] [place], [place] [address] [phone] [facility] [place] [facility] [address] [place], [place] [address] [phone]   VA's National Homeless Call Center 1-877-4AID VET (1-877-424-3838)  Veterans Crisis Line 1-800-273-8255 press 1 Confidential chat-VeteransCrisisLine.net Or Text to 838255  United Way Call 211 or 1-888-892-1162 www.NC211.org  Affordable Housing Resources in [place] nchousingsearch.com   1-877-428-8844  Shelters  [place] [facility] Coalition Housing Hotline 336-691-9521 8:30am-5:30pm [facility] Safety [facility] [address] [place], [place]  [address] [phone] Male veterans 18+ with substance abuse issues Eligibility:  By Referral Only  [place] [facility] [address] [place], [place] [address] [phone] Ext. 347 Adult Men & Women Eligibility: Valid ID & Social Security Card www.greensborourbanministry.org  [facility] [address] [place], [place]  [address] [phone] Male veterans 18+ with substance abuse issues Eligibility:  By Referral Only  [facility] [address] [place], [place]  [address] [phone] Single women 18+ without dependents Open 6pm-8am Eligibility:  Valid ID & Social Security Card Call to check availability  http://westendministries.org/leslieshouse.aspx  [facility] [address] [place], [place]  [address] [phone] Male veterans 18+ with substance abuse/mental health issues Eligibility:  By Referral Only  [facility] [address] [place], [place] [address] [phone] Call to check availability Males 18+ Eligibility: Valid ID & Social Security Card www.odm-hp.org  [facility] [place] [address] [place], [place] [address] [phone] Women 18+ & Families with children Eligibility:  Valid ID & Criminal  Background Check www.salvationarmycarolinas.org/commands/[place]   Community Care of  (Care Management): [phone] [facility] [place] [facility]: Behavioral Health 24 Hour Phone Line 1-800-853-5163 NAMI Hotline 336-370-4264 NAMI [place] [phone]  2-1-1 Referral Service United Way 211 Call 211 or 1-888-892-1162 www.NC211.org A free United Way, 24/7 telephone information and referral service to help link citizens who are seeking help with the community resources they need. In [place], [place] and [place] counties, just dial 211 from your phone. Mental Health Association in [place] [phone] Support groups for anxiety, depression and bipolar disorder, schizophrenia,   family and friends, aftermath of suicide, and mental wellness for Latinos (in Spanish). Mental Health Association in [place] [phone] Offers support groups, [facility] program offers. Psychological, vocational, educational and other rehabilitation services to those who suffer from mental health illness of the 18 and up (5 days a week/5hours a day), offer out patient services like diagnostic evaluation, comprehensive clinical assessments, individual counseling, group therapy, psycho-educational workshops, referral to other specialists, referral to a psychiatrist for an evaluation for medication, consultation, and outreach/training. ADS (Alcohol and Drug Services) (336) [phone] Substance Abuse education, prevention and treatment (detox, assessments, intensive outpatient and inpatient counseling and programs). [facility] [place] (336) 370-0195, [place] (336) [phone] Support groups for posttraumatic stress, depression, and schizophrenia? as well as day programs for individuals with severe mental illness. [facility] [place] (336) [phone] [facility]-FREE-336-275-7896 [facility] [phone] or www.kellinfoundation.org Telehealth platform, Individual counseling across the  lifespan for both mental health and substance use, support groups, advocacy, case management, virtual villages, resource coordination. [facility]- 1-800-256-2452 [facility]-FREE-336-676-6840 or 1-800-853-5163 336-676-6849. Provides mental health services to all residents regardless of ability to pay. [address], [place]. [place]. 24 Domestic Violence Crisis Line [facility] Call for shelter and/or safety planning [facility] [phone] (24/7) [facility] [phone] (24/7)  VA Homeless Hotline 877-424-3838  Veterans Crisis Line 1-800-273-8255 press 1 Confidential chat-VeteransCrisisLine.net Or Text to 838255  [facility] [place] [facility] [address] [place], [place] [address] [phone] Hours: Mon-Fri. 8am-5pm [facility] Mobile Crisis Management Mobile crisis response for mental health, substance abuse or intellectual/developmental disabilities 1-877-626-1772     Disclaimer: This resource list is subject to change at any time and is a starting point for resource identification as of 04/03/2021.   

## 2022-08-23 NOTE — ED Notes (Signed)
Patient has been under IVC since arrival on 08/19/22, however IVC was not placed so was placed today. IVC paperwork from 08/19/22 is in media folder of this chart.

## 2022-08-23 NOTE — ED Notes (Signed)
Patient discharged off unit to home (caregiver) per provider. Patient alert, calm, cooperative, no s/s of distress. Patient discharge information given to caregiver. Patient belongings given to patient.  Patient ambulatory off unit, escorted by NT. Patient transported by caregiver.

## 2022-08-23 NOTE — ED Provider Notes (Addendum)
Emergency Medicine Observation Re-evaluation Note  Stephen Robinson is a 22 y.o. male, seen on rounds today.  Pt initially presented to the ED for complaints of Aggressive Behavior and Hand Injury Currently, the patient is resting.  Physical Exam  BP (!) 123/98 (BP Location: Right Arm)   Pulse 86   Temp 98 F (36.7 C) (Oral)   Resp 16   Ht 6' (1.829 m)   Wt 91.6 kg   SpO2 92%   BMI 27.40 kg/m  Physical Exam General: NAD  ED Course / MDM  EKG:   I have reviewed the labs performed to date as well as medications administered while in observation.  Recent changes in the last 24 hours include evaluated yesterday by psychiatry, requires inpatient admission due to aggression.  Plan  Current plan is for inpatient psychiatric admission.    Ernie Avena, MD 08/23/22 [phone]  Update 11:14AM Pt evaluated by psychiatry and cleared for discharge. Please see their note. IVC rescinded.   Ernie Avena, MD 08/23/22 1114
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
## 2022-08-23 NOTE — ED Notes (Signed)
Pt asked for food again and was informed that breakfast come between 8 and 9am. I offered water and pt refused. Pt walked back to room and laid down.

## 2022-08-23 NOTE — ED Notes (Signed)
Pt is laying down asleep in bed

## 2022-08-23 NOTE — ED Notes (Signed)
Pt continuosly comes out from his room to ask if he can get snacks. Pt told multiple times that it was way past snack time and that he needs to go lay down for bed.

## 2022-08-23 NOTE — Progress Notes (Addendum)
At 8:47am this CSW received conformation via secure email from [facility] with Approval document that pt was approved for Diversion, and process of approval with pt's LME has been granted for CSW to move forward with [facility] referral.   CSW received conformation email from the following:  Delfin Gant, LCSW  Behavioral Health Crisis Line Clinician Pronouns: she, her, hers (What's this?)  [employer]Vicente Serene  [phone] F  [phone]  Per 9:00am morning Bed Meeting with Interdisciplinary team this CSW learned per Psych Provider Earney Navy, [name] that pt's presentation has improved and will be furthered updated.  At 11:04am per Earney Navy, [name] pt has now been psych cleared to discharge back to pt's group home with implementation of medication assistance programing and outpatient therapeutic service.  This CSW will now update pt's LME [employer] that pt is now psych cleared.   This CSW will now remove pt from the [facility] shift report. TOC CSW team to assist if a discharge need becomes identified.    Maryjean Ka, MSW, [facility] 08/23/2022 11:24 AM

## 2022-09-06 ENCOUNTER — Emergency Department (HOSPITAL_COMMUNITY)
Admission: EM | Admit: 2022-09-06 | Discharge: 2022-09-07 | Disposition: A | Discharge status: Home / Self Care | Payer: Medicaid Other | Attending: Emergency Medicine | Admitting: Emergency Medicine

## 2022-09-06 ENCOUNTER — Other Ambulatory Visit: Payer: Self-pay

## 2022-09-06 ENCOUNTER — Encounter (HOSPITAL_COMMUNITY): Payer: Self-pay

## 2022-09-06 DIAGNOSIS — R4689 Other symptoms and signs involving appearance and behavior: Secondary | ICD-10-CM

## 2022-09-06 DIAGNOSIS — F70 Mild intellectual disabilities: Secondary | ICD-10-CM | POA: Diagnosis present

## 2022-09-06 DIAGNOSIS — F17210 Nicotine dependence, cigarettes, uncomplicated: Secondary | ICD-10-CM | POA: Diagnosis not present

## 2022-09-06 DIAGNOSIS — Z79899 Other long term (current) drug therapy: Secondary | ICD-10-CM | POA: Diagnosis not present

## 2022-09-06 DIAGNOSIS — F4389 Other reactions to severe stress: Secondary | ICD-10-CM | POA: Insufficient documentation

## 2022-09-06 DIAGNOSIS — F430 Acute stress reaction: Secondary | ICD-10-CM | POA: Diagnosis present

## 2022-09-06 DIAGNOSIS — R456 Violent behavior: Secondary | ICD-10-CM | POA: Diagnosis present

## 2022-09-06 LAB — CBC WITH DIFFERENTIAL/PLATELET
Abs Immature Granulocytes: 0.04 10*3/uL (ref 0.00–0.07)
Basophils Absolute: 0 10*3/uL (ref 0.0–0.1)
Basophils Relative: 0 %
Eosinophils Absolute: 0.1 10*3/uL (ref 0.0–0.5)
Eosinophils Relative: 1 %
HCT: 39 % (ref 39.0–52.0)
Hemoglobin: 14.2 g/dL (ref 13.0–17.0)
Immature Granulocytes: 0 %
Lymphocytes Relative: 30 %
Lymphs Abs: 3.2 10*3/uL (ref 0.7–4.0)
MCH: 31.8 pg (ref 26.0–34.0)
MCHC: 36.4 g/dL — ABNORMAL HIGH (ref 30.0–36.0)
MCV: 87.2 fL (ref 80.0–100.0)
Monocytes Absolute: 1.4 10*3/uL — ABNORMAL HIGH (ref 0.1–1.0)
Monocytes Relative: 13 %
Neutro Abs: 5.9 10*3/uL (ref 1.7–7.7)
Neutrophils Relative %: 56 %
Platelets: 259 10*3/uL (ref 150–400)
RBC: 4.47 MIL/uL (ref 4.22–5.81)
RDW: 12.9 % (ref 11.5–15.5)
WBC: 10.7 10*3/uL — ABNORMAL HIGH (ref 4.0–10.5)
nRBC: 0 % (ref 0.0–0.2)

## 2022-09-06 LAB — COMPREHENSIVE METABOLIC PANEL
ALT: 21 U/L (ref 0–44)
AST: 52 U/L — ABNORMAL HIGH (ref 15–41)
Albumin: 4.6 g/dL (ref 3.5–5.0)
Alkaline Phosphatase: 69 U/L (ref 38–126)
Anion gap: 14 (ref 5–15)
BUN: 12 mg/dL (ref 6–20)
CO2: 22 mmol/L (ref 22–32)
Calcium: 10.1 mg/dL (ref 8.9–10.3)
Chloride: 102 mmol/L (ref 98–111)
Creatinine, Ser: 0.95 mg/dL (ref 0.61–1.24)
GFR, Estimated: >60 mL/min (ref >60)
Glucose, Bld: 112 mg/dL — ABNORMAL HIGH (ref 70–99)
Potassium: 3.9 mmol/L (ref 3.5–5.1)
Sodium: 138 mmol/L (ref 135–145)
Total Bilirubin: 1.1 mg/dL (ref 0.3–1.2)
Total Protein: 8.2 g/dL — ABNORMAL HIGH (ref 6.5–8.1)

## 2022-09-06 LAB — ACETAMINOPHEN LEVEL: Acetaminophen (Tylenol), Serum: <10 ug/mL — ABNORMAL LOW (ref 10–30)

## 2022-09-06 LAB — ETHANOL: Alcohol, Ethyl (B): <10 mg/dL (ref <10)

## 2022-09-06 LAB — SALICYLATE LEVEL: Salicylate Lvl: <7 mg/dL — ABNORMAL LOW (ref 7.0–30.0)

## 2022-09-06 MED ORDER — STERILE WATER FOR INJECTION IJ SOLN
INTRAMUSCULAR | Status: AC
  Filled 2022-09-06: qty 10

## 2022-09-06 MED ORDER — ZIPRASIDONE MESYLATE 20 MG IM SOLR
20.0000 mg | Freq: Once | INTRAMUSCULAR | Status: DC
  Filled 2022-09-06: qty 20

## 2022-09-06 NOTE — BH Assessment (Signed)
This TTS consult has been deferred to [place]. The [employer] is Leotis Shames, [phone]. Patient will be seen by Dr. Toni Arthurs at 0100. Secure chat completed.

## 2022-09-06 NOTE — ED Notes (Signed)
IVC paperwork complete, copies in blue zone, expires 09/13/22, getting case number tomorrow (09/07/22) from [place] [facility] [employer]
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
## 2022-09-06 NOTE — ED Provider Notes (Signed)
[place] EMERGENCY DEPARTMENT AT [facility] Provider Note   CSN: [id number] Arrival date & time: 09/06/22  1630     History  Chief Complaint  Patient presents with   Psychiatric Evaluation    Stephen Robinson is a 22 y.o. male past with history of ADHD, OCD presents to the ED by IVC.  Patient is from an alternative family living facility.  He has been diagnosed with mild intellectual disabilities, antisocial personality disorder, bipolar disorder, ADHD, intermittent explosive disorder and suicidal ideations.  He has eloped twice today from the home.  Earlier this evening he was charged with disorderly conduct by police for jumping out of a moving vehicle, harassing other people and cursing at the police officers.  Patient was found in the waiting area wandering around.  He is hostile and angry.  He denies any chest pain, shortness of breath, headache, lightheadedness, nausea, vomiting, back pain, abdominal pain, urinary symptoms, rash.  States that he used cocaine, marijuana and vaping earlier today.  He denies any SI/HI, visual or auditory hallucinations.  HPI    Past Medical History:  Diagnosis Date   ADHD    OCD (obsessive compulsive disorder)    History reviewed. No pertinent surgical history.   Home Medications Prior to Admission medications   Medication Sig Start Date End Date Taking? Authorizing Provider  cholecalciferol (VITAMIN D3) 25 MCG (1000 UNIT) tablet Take 1,000 Units by mouth daily.    [provider]  clonazePAM (KLONOPIN) 1 MG tablet Take 1 mg by mouth 2 (two) times daily.    [provider]  cloZAPine (CLOZARIL) 25 MG tablet Take 75 mg by mouth every morning.    [provider]  clozapine (CLOZARIL) 50 MG tablet Take 150 mg by mouth at bedtime.    [provider]  diphenhydrAMINE (BENADRYL) 25 MG tablet Take 25-50 mg by mouth every 6 (six) hours as needed for allergies (and agitation).    [provider]  diphenhydrAMINE (BENADRYL) 50 MG capsule Take 50 mg by mouth at bedtime.    [provider]  divalproex (DEPAKOTE ER) 250 MG 24 hr tablet Take 250 mg by mouth in the morning and at bedtime. Takes with the 500 mg tablet = 750 mg    [provider]  divalproex (DEPAKOTE ER) 500 MG 24 hr tablet Take 500 mg by mouth in the morning and at bedtime. Pt takes with 250 for a total of 750mg     [provider]  fluticasone (FLONASE) 50 MCG/ACT nasal spray Place 1 spray into both nostrils daily.    [provider]  guanFACINE (INTUNIV) 4 MG TB24 ER tablet Take 1 tablet (4 mg total) by mouth at bedtime. Patient taking differently: Take 4 mg by mouth in the morning. 02/09/21 08/21/22  Chesley Noon, MD  haloperidol (HALDOL) 10 MG tablet Take 10 mg by mouth 4 (four) times daily as needed (agitation).    [provider]  ipratropium (ATROVENT) 0.06 % nasal spray Place 2 sprays into both nostrils 2 (two) times daily as needed (Pt uses for drooling (SL)). Pt uses for drooling (SL)    [provider]  levothyroxine (SYNTHROID) 25 MCG tablet Take 1 tablet (25 mcg total) by mouth daily. 05/06/22   Rankin, Shuvon B, NP  LORazepam (ATIVAN) 2 MG tablet Take 4 mg by mouth every 6 (six) hours as needed for anxiety.    [provider]  metoprolol succinate (TOPROL-XL) 25 MG 24 hr tablet Take 12.5  mg by mouth daily.    [provider]  OLANZapine (ZYPREXA) 5 MG tablet Take 5 mg by mouth 2 (two) times daily as needed (agitation).    [provider]  polyethylene glycol (MIRALAX / GLYCOLAX) 17 g packet Take 17 g by mouth 2 (two) times daily.    [provider]  senna (SENOKOT) 8.6 MG TABS tablet Take 2 tablets by mouth in the morning and at bedtime.    [provider]  sodium chloride (OCEAN) 0.65 % SOLN nasal spray Place 1 spray into both nostrils as needed for congestion.    [provider]      Allergies    Seroquel  [quetiapine]    Review of Systems   Review of Systems Negative except as per HPI.  Physical Exam Updated Vital Signs BP 128/77 (BP Location: Right Arm)   Pulse (!) 127   Temp 99 F (37.2 C) (Oral)   Resp 16   Ht 6' (1.829 m)   Wt 91.6 kg   SpO2 96%   BMI 27.40 kg/m  Physical Exam Vitals and nursing note reviewed.  Constitutional:      Appearance: Normal appearance.  HENT:     Head: Normocephalic and atraumatic.     Mouth/Throat:     Mouth: Mucous membranes are moist.  Eyes:     General: No scleral icterus. Cardiovascular:     Rate and Rhythm: Normal rate and regular rhythm.     Pulses: Normal pulses.     Heart sounds: Normal heart sounds.  Pulmonary:     Effort: Pulmonary effort is normal.     Breath sounds: Normal breath sounds.  Abdominal:     General: Abdomen is flat.     Palpations: Abdomen is soft.     Tenderness: There is no abdominal tenderness.  Musculoskeletal:        General: No deformity.  Skin:    General: Skin is warm.     Findings: No rash.  Neurological:     General: No focal deficit present.     Mental Status: He is alert.  Psychiatric:        Mood and Affect: Mood normal.     Comments: Cooperative on exam.     ED Results / Procedures / Treatments   Labs (all labs ordered are listed, but only abnormal results are displayed) Labs Reviewed  COMPREHENSIVE METABOLIC PANEL - Abnormal; Notable for the following components:      Result Value   Glucose, Bld 112 (*)    Total Protein 8.2 (*)    AST 52 (*)    All other components within normal limits  CBC WITH DIFFERENTIAL/PLATELET - Abnormal; Notable for the following components:   WBC 10.7 (*)    MCHC 36.4 (*)    Monocytes Absolute 1.4 (*)    All other components within normal limits  ACETAMINOPHEN LEVEL - Abnormal; Notable for the following components:   Acetaminophen (Tylenol), Serum <10 (*)    All other components within normal limits  SALICYLATE LEVEL - Abnormal; Notable for the  following components:   Salicylate Lvl <7.0 (*)    All other components within normal limits  ETHANOL  RAPID URINE DRUG SCREEN, HOSP PERFORMED    EKG None  Radiology No results found.  Procedures Procedures    Medications Ordered in ED Medications  ziprasidone (GEODON) injection 20 mg (has no administration in time range)  sterile water (preservative free) injection (has no administration in time range)  ED Course/ Medical Decision Making/ A&P                             Medical Decision Making Amount and/or Complexity of Data Reviewed Labs: ordered.   This patient presents to the ED for aggressive behavior brought in by IVC, this involves an extensive number of treatment options, and is a complaint that carries with a high risk of complications and morbidity.  The differential diagnosis includes psychosis, drug abuse, electrolyte abnormalities, alcohol intoxication, withdrawal.  This is not an exhaustive list.  Lab tests: I ordered and personally interpreted labs.  The pertinent results include: WBC 10.7. Hbg unremarkable. Platelets unremarkable. Electrolytes unremarkable. BUN, creatinine unremarkable.  Urine drug screen pending.  Problem list/ ED course/ Critical interventions/ Medical management: HPI: See above Vital signs within normal range and stable throughout visit. Laboratory/imaging studies significant for: See above. On physical examination, patient is afebrile and appears in no acute distress.  Patient presents after having aggressive behaviors and was IVC by Emergency planning/management officer.  On my examination patient was cooperative. He denies any SI or HI, visual or auditory hallucinations.  He denies any chest pain, shortness of breath, nausea vomiting, headache, abdominal pain. Presentation not consistent with acute organic causes to include delirium, dementia or drug induced disorders. Psychiatric consult placed in will continue patient's home.  Patient was medically  cleared and transferred to psychiatric care. I have reviewed the patient home medicines and have made adjustments as needed.  Cardiac monitoring/EKG: The patient was maintained on a cardiac monitor.  I personally reviewed and interpreted the cardiac monitor which showed an underlying rhythm of: sinus rhythm.  Additional history obtained: External records from outside source obtained and reviewed including: Chart review including previous notes, labs, imaging.  Consultations obtained:  Disposition Admit to psychiatric care. This chart was dictated using voice recognition software.  Despite best efforts to proofread,  errors can occur which can change the documentation meaning.          Final Clinical Impression(s) / ED Diagnoses Final diagnoses:  Aggressive behavior    Rx / DC Orders ED Discharge Orders     None         Jeanelle Malling, [place] 09/06/22 2038    Gloris Manchester, MD 09/08/22 1555

## 2022-09-06 NOTE — ED Notes (Signed)
Pt belongings placed in locker number 8 

## 2022-09-06 NOTE — ED Triage Notes (Addendum)
Pt was found in waiting area wondering around. Pt nonverbal and will not communicate with staff or LEO. Pt is not redirectable and continues to come out of room and wonder around triage. Pt has hx of aggressive behavior. Pt does have a legal guardian [name] and number is in the chart. Legal guardian was notified that pt is in the ED. Per Beatriz Stallion pt eloped from group home and the group home took IVC paperwork out on pt. [facility] Dept should be getting IVC paperwork from magistrates office and bringing them to ED to serve them.    [facility] is the group home pt is from.   Beatriz Stallion [phone]

## 2022-09-07 DIAGNOSIS — R4689 Other symptoms and signs involving appearance and behavior: Secondary | ICD-10-CM

## 2022-09-07 MED ORDER — NICOTINE 21 MG/24HR TD PT24
21.0000 mg | MEDICATED_PATCH | Freq: Every day | TRANSDERMAL | Status: DC
  Administered 2022-09-07: 21 mg via TRANSDERMAL
  Filled 2022-09-07: qty 1

## 2022-09-07 MED ORDER — HALOPERIDOL 5 MG PO TABS
10.0000 mg | ORAL_TABLET | Freq: Three times a day (TID) | ORAL | Status: DC | PRN
  Administered 2022-09-07: 10 mg via ORAL
  Filled 2022-09-07: qty 2

## 2022-09-07 MED ORDER — LORAZEPAM 2 MG/ML IJ SOLN: 2.0000 mg | Freq: Three times a day (TID) | INTRAMUSCULAR | Status: DC | PRN

## 2022-09-07 MED ORDER — HALOPERIDOL 5 MG PO TABS: 10.0000 mg | ORAL_TABLET | Freq: Three times a day (TID) | ORAL | Status: DC | PRN

## 2022-09-07 MED ORDER — HALOPERIDOL 5 MG PO TABS: 10.0000 mg | ORAL_TABLET | Freq: Once | ORAL | Status: DC

## 2022-09-07 MED ORDER — LORAZEPAM 1 MG PO TABS
2.0000 mg | ORAL_TABLET | Freq: Three times a day (TID) | ORAL | Status: DC | PRN
  Administered 2022-09-07: 2 mg via ORAL
  Filled 2022-09-07: qty 2

## 2022-09-07 MED ORDER — HALOPERIDOL LACTATE 5 MG/ML IJ SOLN: 10.0000 mg | Freq: Three times a day (TID) | INTRAMUSCULAR | Status: DC | PRN

## 2022-09-07 MED ORDER — LORAZEPAM 1 MG PO TABS: 1.0000 mg | ORAL_TABLET | Freq: Once | ORAL | Status: DC

## 2022-09-07 NOTE — ED Provider Notes (Signed)
Emergency Medicine Observation Re-evaluation Note  Dezmon Toronto is a 22 y.o. male, seen on rounds today.  Pt initially presented to the ED for complaints of Psychiatric Evaluation Currently, the patient is awake and alert.  He is complaining of being hungry.    Physical Exam  BP 128/82 (BP Location: Right Arm)   Pulse 79   Temp (!) 97.4 F (36.3 C) (Oral)   Resp 16   Ht 6' (1.829 m)   Wt 91.6 kg   SpO2 98%   BMI 27.40 kg/m  Physical Exam General: awake and alert Cardiac: rr Lungs: clear Psych: calm  ED Course / MDM  EKG:   I have reviewed the labs performed to date as well as medications administered while in observation.  Recent changes in the last 24 hours include none.  Plan  Current plan is for awaiting TTS consult.    Jacalyn Lefevre, MD 09/07/22 [phone]

## 2022-09-07 NOTE — ED Notes (Signed)
Pt walked to shower in purple section.

## 2022-09-07 NOTE — Progress Notes (Addendum)
If Disposition requires inpatient behavioral health placement then CSW/ Disposition team will need to obtain 5 denials before starting Diversion process for the approval to send to [facility]. Per chart review this pt is IDD and will need Diversion Exception from LME. [employer] fax [phone] or [phone]. Currently obtained is 2 denials:[facility] and [facility] due to no beds per [facility] AC Fransico Michael, RN. 1st shift CSW to follow up due to this CSW's shift ended.   Stephen Robinson, MSW, [facility] 09/07/2022 12:34 AM

## 2022-09-07 NOTE — ED Notes (Addendum)
Patient demanding to leave ED as IVC papers have been rescinded. I have informed Haviland MD that patient refusing to await group home to pick up. Neither security nor [employer] will hold patient until group home arrives since IVC papers no longer active. Patient provided belongings and ambulatory out from door. I have called [place] with the group home, no answer and I left message informing her that patient refuses to await ride.

## 2022-09-07 NOTE — Consult Note (Signed)
BH ED ASSESSMENT   Reason for Consult:  aggression Referring Physician:  Particia Nearing Patient Identification: Stephen Robinson MRN:  [id number] ED Chief Complaint: Aggressive behavior of adult  Diagnosis:  Principal Problem:   Aggressive behavior of adult Active Problems:   Mild intellectual disability   Stress reaction causing mixed disturbance of emotion and conduct   ED Assessment Time Calculation: Start Time: 0900 Stop Time: 1000 Total Time in Minutes (Assessment Completion): 60   HPI:   Stephen Robinson is a 22 y.o. male patient with history of ADHD, OCD presents to the ED by IVC.  Patient is from an alternative family living facility.  He has been diagnosed with mild intellectual disabilities, antisocial personality disorder, bipolar disorder, ADHD, intermittent explosive disorder and suicidal ideations.  He has eloped twice today from the home.  Earlier this evening he was charged with disorderly conduct by police for jumping out of a moving vehicle, harassing other people and cursing at the police officers.  Patient was found in the waiting area wandering around.  He is hostile and angry.  He denies any chest pain, shortness of breath, headache, lightheadedness, nausea, vomiting, back pain, abdominal pain, urinary symptoms, rash.  States that he used cocaine, marijuana and vaping earlier today.  He denies any SI/HI, visual or auditory hallucinations.   Subjective:   Pt seen at [facility] for face to face psychiatric evaluation. Pt is sitting on his bed, and is very discharged focused. Pt states "listen, I did some bath salts last night and that's why I went so crazy. But I am good this morning, I don't want to hurt myself or other people and I am ready to leave. But I don't want to go back to the group home I would prefer a homeless shelter." Pt denies any suicidal or homicidal ideations. He denies auditory or visual hallucinations. I asked patient where he acquired bath salts from and he  was unable to give any details about this. It appears earlier he endorsed cocaine and THC use to a different provider so unsure if he is telling the truth about different substances he is using. UDS is still pending so unable to verify at this time.   Per chart review appears pt has history of anger outbursts and disruptive behaviors. He resides at [facility] group home, managed by Beatriz Stallion. I spoke with her at [phone]. She states he has outbursts about 1 per month, and they are working closely with OP psychiatry to titrate his medications, specifically Clozaril, to help with his aggression. She states he can return today, they can arrange staff and transportation to pick him up around 5-6pm tonight. She has no further safety concerns about him discharging at this time.   Also spoke with LG Jonte Joe who is agreeable with this plan for him to discharge back to group home today.   No medication changes were made. Pt is psychiatrically cleared.   Past Psychiatric History:  See above  Risk to Self or Others: Is the patient at risk to self? No Has the patient been a risk to self in the past 6 months? No Has the patient been a risk to self within the distant past? No Is the patient a risk to others? No Has the patient been a risk to others in the past 6 months? Yes Has the patient been a risk to others within the distant past? No  [place] Scale:  Flowsheet Row ED from 09/06/20[facility] in [facility] Department  at [facility] ED from 08/19/2022 in [facility] Emergency Department at [facility] ED from 07/09/2022 in [facility]  C-SSRS RISK CATEGORY No Risk No Risk High Risk       Past Medical History:  Past Medical History:  Diagnosis Date   ADHD    OCD (obsessive compulsive disorder)    History reviewed. No pertinent surgical history. Family History: History reviewed. No pertinent family history. Social  History:  Social History   Substance and Sexual Activity  Alcohol Use Yes     Social History   Substance and Sexual Activity  Drug Use Yes   Types: Marijuana    Social History   Socioeconomic History   Marital status: Single    Spouse name: Not on file   Number of children: Not on file   Years of education: Not on file   Highest education level: Not on file  Occupational History   Not on file  Tobacco Use   Smoking status: Every Day    Types: Cigarettes   Smokeless tobacco: Never  Vaping Use   Vaping Use: Every day  Substance and Sexual Activity   Alcohol use: Yes   Drug use: Yes    Types: Marijuana   Sexual activity: Not on file  Other Topics Concern   Not on file  Social History Narrative   Not on file   Social Determinants of Health   Financial Resource Strain: Not on file  Food Insecurity: Food Insecurity Present (07/09/2022)   Hunger Vital Sign    Worried About Running Out of Food in the Last Year: Sometimes true    Ran Out of Food in the Last Year: Not on file  Transportation Needs: No Transportation Needs (07/09/2022)   PRAPARE - Administrator, Civil Service (Medical): No    Lack of Transportation (Non-Medical): No  Physical Activity: Not on file  Stress: Not on file  Social Connections: Not on file    Allergies:   Allergies  Allergen Reactions   Seroquel [Quetiapine] Swelling    Labs:  Results for orders placed or performed during the hospital encounter of 09/06/22 (from the past 48 hour(s))  Comprehensive metabolic panel     Status: Abnormal   Collection Time: 09/06/22  5:27 PM  Result Value Ref Range   Sodium 138 135 - 145 mmol/L   Potassium 3.9 3.5 - 5.1 mmol/L   Chloride 102 98 - 111 mmol/L   CO2 22 22 - 32 mmol/L   Glucose, Bld 112 (H) 70 - 99 mg/dL    Comment: Glucose reference range applies only to samples taken after fasting for at least 8 hours.   BUN 12 6 - 20 mg/dL   Creatinine, Ser 1.61 0.61 - 1.24 mg/dL   Calcium  09.6 8.9 - 04.5 mg/dL   Total Protein 8.2 (H) 6.5 - 8.1 g/dL   Albumin 4.6 3.5 - 5.0 g/dL   AST 52 (H) 15 - 41 U/L   ALT 21 0 - 44 U/L   Alkaline Phosphatase 69 38 - 126 U/L   Total Bilirubin 1.1 0.3 - 1.2 mg/dL   GFR, Estimated >40 >98 mL/min    Comment: (NOTE) Calculated using the CKD-EPI Creatinine Equation (2021)    Anion gap 14 5 - 15    Comment: Performed at [facility], [address] [place], [place] [address]  Ethanol     Status: None   Collection Time: 09/06/22  5:27 PM  Result Value Ref Range   Alcohol, Ethyl (B) <10 <10 mg/dL    Comment: (NOTE) Lowest detectable limit for serum alcohol is 10 mg/dL.  For medical purposes only. Performed at [facility], [address] [place], [place] [address]   CBC with Diff     Status: Abnormal   Collection Time: 09/06/22  5:27 PM  Result Value Ref Range   WBC 10.7 (H) 4.0 - 10.5 K/uL   RBC 4.47 4.22 - 5.81 MIL/uL   Hemoglobin 14.2 13.0 - 17.0 g/dL   HCT 95.2 84.1 - 32.4 %   MCV 87.2 80.0 - 100.0 fL   MCH 31.8 26.0 - 34.0 pg   MCHC 36.4 (H) 30.0 - 36.0 g/dL   RDW 40.1 02.7 - 25.3 %   Platelets 259 150 - 400 K/uL   nRBC 0.0 0.0 - 0.2 %   Neutrophils Relative % 56 %   Neutro Abs 5.9 1.7 - 7.7 K/uL   Lymphocytes Relative 30 %   Lymphs Abs 3.2 0.7 - 4.0 K/uL   Monocytes Relative 13 %   Monocytes Absolute 1.4 (H) 0.1 - 1.0 K/uL   Eosinophils Relative 1 %   Eosinophils Absolute 0.1 0.0 - 0.5 K/uL   Basophils Relative 0 %   Basophils Absolute 0.0 0.0 - 0.1 K/uL   Immature Granulocytes 0 %   Abs Immature Granulocytes 0.04 0.00 - 0.07 K/uL    Comment: Performed at [facility], [address], [place], [place] [address]  Acetaminophen level     Status: Abnormal   Collection Time: 09/06/22  5:27 PM  Result Value Ref Range   Acetaminophen (Tylenol), Serum <10 (L) 10 - 30 ug/mL    Comment: (NOTE) Therapeutic concentrations vary significantly. A range of 10-30 ug/mL  may be an effective concentration for  many patients. However, some  are best treated at concentrations outside of this range. Acetaminophen concentrations >150 ug/mL at 4 hours after ingestion  and >50 ug/mL at 12 hours after ingestion are often associated with  toxic reactions.  Performed at [facility], [address] [place], [place] [address]   Salicylate level     Status: Abnormal   Collection Time: 09/06/22  5:27 PM  Result Value Ref Range   Salicylate Lvl <7.0 (L) 7.0 - 30.0 mg/dL    Comment: Performed at [facility], [address], [place], [place] [address]    Current Facility-Administered Medications  Medication Dose Route Frequency Provider Last Rate Last Admin   haloperidol (HALDOL) tablet 10 mg  10 mg Oral Once Eligha Bridegroom, NP       LORazepam (ATIVAN) tablet 1 mg  1 mg Oral Once Eligha Bridegroom, NP       nicotine (NICODERM CQ - dosed in mg/24 hours) patch 21 mg  21 mg Transdermal Daily Jacalyn Lefevre, MD   21 mg at 09/07/22 0951   ziprasidone (GEODON) injection 20 mg  20 mg Intramuscular Once Dorthy Cooler, [place]       Current Outpatient Medications  Medication Sig Dispense Refill   cholecalciferol (VITAMIN D3) 25 MCG (1000 UNIT) tablet Take 1,000 Units by mouth daily.     clonazePAM (KLONOPIN) 1 MG tablet Take 1 mg by mouth 2 (two) times daily.     cloZAPine (CLOZARIL) 25 MG tablet Take 75 mg by mouth every morning.     clozapine (CLOZARIL) 50 MG tablet Take 150 mg by mouth at bedtime.  diphenhydrAMINE (BENADRYL) 25 MG tablet Take 25-50 mg by mouth every 6 (six) hours as needed for allergies (and agitation).     diphenhydrAMINE (BENADRYL) 50 MG capsule Take 50 mg by mouth at bedtime.     divalproex (DEPAKOTE ER) 250 MG 24 hr tablet Take 250 mg by mouth in the morning and at bedtime. Takes with the 500 mg tablet = 750 mg     divalproex (DEPAKOTE ER) 500 MG 24 hr tablet Take 500 mg by mouth in the morning and at bedtime. Pt takes with 250 for a total of 750mg      fluticasone  (FLONASE) 50 MCG/ACT nasal spray Place 1 spray into both nostrils daily.     guanFACINE (INTUNIV) 4 MG TB24 ER tablet Take 1 tablet (4 mg total) by mouth at bedtime. (Patient taking differently: Take 4 mg by mouth in the morning.) 30 tablet 2   haloperidol (HALDOL) 10 MG tablet Take 10 mg by mouth 4 (four) times daily as needed (agitation).     ipratropium (ATROVENT) 0.06 % nasal spray Place 2 sprays into both nostrils 2 (two) times daily as needed (Pt uses for drooling (SL)). Pt uses for drooling (SL)     levothyroxine (SYNTHROID) 25 MCG tablet Take 1 tablet (25 mcg total) by mouth daily.     LORazepam (ATIVAN) 2 MG tablet Take 4 mg by mouth every 6 (six) hours as needed for anxiety.     metoprolol succinate (TOPROL-XL) 25 MG 24 hr tablet Take 12.5 mg by mouth daily.     OLANZapine (ZYPREXA) 5 MG tablet Take 5 mg by mouth 2 (two) times daily as needed (agitation).     polyethylene glycol (MIRALAX / GLYCOLAX) 17 g packet Take 17 g by mouth 2 (two) times daily.     senna (SENOKOT) 8.6 MG TABS tablet Take 2 tablets by mouth in the morning and at bedtime.     sodium chloride (OCEAN) 0.65 % SOLN nasal spray Place 1 spray into both nostrils as needed for congestion.      Psychiatric Specialty Exam: Presentation  General Appearance:  Appropriate for Environment  Eye Contact: Fair  Speech: Normal Rate  Speech Volume: Normal  Handedness: Right   Mood and Affect  Mood: Anxious  Affect: Congruent   Thought Process  Thought Processes: Coherent  Descriptions of Associations:Intact  Orientation:Full (Time, Place and Person)  Thought Content:WDL  History of Schizophrenia/Schizoaffective disorder:No  Duration of Psychotic Symptoms:No data recorded Hallucinations:Hallucinations: None  Ideas of Reference:None  Suicidal Thoughts:Suicidal Thoughts: No  Homicidal Thoughts:Homicidal Thoughts: No   Sensorium  Memory: Immediate Fair; Recent  Fair  Judgment: Fair  Insight: Fair   Art therapist  Concentration: Fair  Attention Span: Fair  Recall: Fair  Fund of Knowledge: Fair  Language: Fair   Psychomotor Activity  Psychomotor Activity: Psychomotor Activity: Normal   Assets  Assets: Desire for Improvement; Physical Health; Resilience; Social Support    Sleep  Sleep: Sleep: Good   Physical Exam: Physical Exam Neurological:     Mental Status: He is alert and oriented to person, place, and time.  Psychiatric:        Attention and Perception: Attention normal.        Mood and Affect: Mood is anxious.        Speech: Speech normal.        Behavior: Behavior is cooperative.        Thought Content: Thought content normal.    Review of Systems  Psychiatric/Behavioral:  The  patient is nervous/anxious.        Aggressive behaviors  All other systems reviewed and are negative.  Blood pressure 122/72, pulse 67, temperature (!) 97.4 F (36.3 C), temperature source Oral, resp. rate 17, height 6' (1.829 m), weight 91.6 kg, SpO2 99 %. Body mass index is 27.4 kg/m.  Medical Decision Making: Pt case reviewed and discussed with Dr. Lucianne Muss. Pt does not meet criteria for IVC or inpatient psychiatric treatment. Pt is able to contract for safety at this time, and will return to group home this evening.   - no medication changes - resources provided in AVS   Disposition: No evidence of imminent risk to self or others at present.   Patient does not meet criteria for psychiatric inpatient admission. Supportive therapy provided about ongoing stressors. Discussed crisis plan, support from social network, calling 911, coming to the Emergency Department, and calling Suicide Hotline.  Eligha Bridegroom, NP 09/07/2022 10:10 AM

## 2022-09-07 NOTE — ED Notes (Signed)
Security at bedside to help deescalate patient from wanting to leave.

## 2022-09-07 NOTE — ED Notes (Signed)
Pt wandering around department hall informs the writer that he is bored and just wants someone to talk to or something to do. Pt given crayons and coloring sheets, and cardboard to make a walkie talkie.

## 2022-09-07 NOTE — ED Notes (Signed)
Pt wandering trying to eat hand sanitizer and pushing the counter.

## 2022-09-07 NOTE — ED Notes (Signed)
Pt eloped out of department, Security retrieved patient and brought him back to his bed. Pt compliant after being put back into bed.

## 2022-09-07 NOTE — Discharge Instructions (Signed)
Outpatient psychiatric Services  Walk in hours for medication management Monday, Wednesday, Thursday, and Friday from 8:00 AM to 11:00 AM Recommend arriving by by 7:30 AM.  It is first come first serve.    Walk in hours for therapy intake Monday and Wednesday only 8:00 AM to 11:00 AM Encouraged to arrive by 7:30 AM.  It is first come first serve   Inpatient patient psychiatric services The [facility] offers comprehensive behavioral heath care services for mental health and substance abuse treatment.  Social work can also assist with referral to or getting you into a rehabilitation program short or long term        https://www.ncdhhs.gov/accessing-IDD-services-infographic-color-82521/open  Local Management Entity?Managed Care Organizations (LME/MCOs) NCDHHS Currently has 6 LME/MCOs operating under the Medicaid 1915 b/c Waiver  [facility] [address] [place], [place], [address] [phone] phone [phone] fax Crisis Line: [phone] Counties served: [place], [place], [place], [place], [place], [place]   [facility] [address] [place], [place], [address] [phone] phone [phone] fax Crisis Line: [phone] Counties served: [place], [place], [place], [place], [place], [place], [place], [place], [place], [place]  [facility] [address] [place], [place], [address] [phone] phone [phone] fax Crisis Line: [phone] Counties served: [place], [place], [place], [place], [place], [place], [place], [place], [place], [place], [place], [place], [place], [place]  [facility] [address] [place], [place], [address] [phone] phone [phone] fax Crisis Line: [phone] Counties served: [place], [place], [place], [place], [place], [place], [place], [place], [place], [place], [place]  [facility] [address] [place], [place],  [address] [phone] phone Crisis Line: [phone] Counties served: [place], [place], [place], [place], [place], [place], [place], [place], [place], [place], [place], [place], [place], [place], [place], [place], [place], [place], [place], [place], [place], [place], [place], [place], [place], [place], [place], [place]  [facility] [address] [place], [place], [address] [phone] phone [phone] fax Crisis Line: [phone] Counties served: [place], [place], [place], [place], [place], [place], [place], [place], [place], [place], [place], [place], [place], [place], [place], [place], [place], [place], [place], [place], [place], [place], [place], [place], [place], [place], [place], [place], [place], [place], [place]  Https://www.ncdhhs.gov/provddewr                            Autism Services/Providers  The following are clinicians within [place] who are supposed to be specialized in working with individuals who have autism, according the [facility] Provider Directory- https://shcextweb.[facility].org/pd/cliniciansbehavioral.  Jennifer Linn Gagne [address] [place], [place], [address] [phone] phone  Laurie Ann Kauffman [address] [place], [place], [address] [phone] phone  Leslie Rochelle Gonzalez [address] [place], [place], [address] [phone] phone  Mary Paige Powell [address] [place], [place], [address] [phone] phone  Vickie Pearman Whitley [address] [place], [place], [address] [phone] phone  William van Cantrell [address] [place], [place], [address] [phone] phone  [facility] [address] [place], [place], [address] [phone] phone  It is extremely important for caregivers to link with support groups to lessen the feeling of being isolated with this and for validation. Below is a  support group for caregivers and family who have loved ones with ASD. The Autism Society of [place] can also provide additional resources that would be helpful. This organization does have a camp for children and adolescents with ASD to meet, socialize, and engage in activities together. Also check out where   they may be able to also help with placement as well as assessments and therapy.  [facility]  [address] [place], [place], [address] [phone] phone Includes: Early Intervention, General Treatment for ASD, Classroom Readiness, Social Skills Groups, and Group Family Training  [facility] Society of [place] [facility] ([facility]) [address] [place], [place], [address] [phone] phone  [facility] [place] [address] [place], [place], [address] [phone] phone  [facility] [address] [place], [place], [address] [phone] phone  Autism Society of [place] Find a Chapter/Support Group Chapters and Support Groups provide a place for families who face similar challenges to feel understood as they offer each other encouragement. gu[place]@autismsociety-[place].org  www.facebook.com/groups/asnc.[place] For more information about events and meetups, please see the calendar, contact the Chapter by email, or join the Facebook group. https://www.autismsociety-.org    

## 2022-09-07 NOTE — ED Provider Notes (Signed)
Patient has been evaluated by [facility] team and is psych cleared for discharge.   Pt is alert, conversant. Appears happy/smiling. Normal appetite.   Denies thoughts of harm to self or others.   Vitals normal.  On chart review, recent behaviors appears c/w patients baseline episodic periods agitated behavior, impulse control issues.   Pt currently appears stable for d/c per [facility] plan.   Rec outpatient BH f/u.  Return precautions provided.      Cathren Laine, MD 09/07/22 1426

## 2022-09-07 NOTE — ED Notes (Addendum)
NT attempted to wake pt. Pt not willing to wake up.

## 2022-09-07 NOTE — Progress Notes (Addendum)
LCSW Progress Note  [id number]   Stephen Robinson  09/07/2022  12:26 AM    There are no available beds within [facility] system per [facility] AC Fransico Michael, RN. Pt will be seen by Dr.Fuller with [facility]. Disposition is still PENDING.    Maryjean Ka, MSW, LCSWA 09/07/2022 12:26 AM

## 2022-09-11 ENCOUNTER — Emergency Department (HOSPITAL_COMMUNITY): Payer: Medicaid Other

## 2022-09-11 ENCOUNTER — Other Ambulatory Visit: Payer: Self-pay

## 2022-09-11 ENCOUNTER — Emergency Department (HOSPITAL_COMMUNITY)
Admission: EM | Admit: 2022-09-11 | Discharge: 2022-09-11 | Disposition: A | Discharge status: Home / Self Care | Payer: Medicaid Other | Attending: Emergency Medicine | Admitting: Emergency Medicine

## 2022-09-11 ENCOUNTER — Encounter (HOSPITAL_COMMUNITY): Payer: Self-pay | Admitting: Emergency Medicine

## 2022-09-11 DIAGNOSIS — M79671 Pain in right foot: Secondary | ICD-10-CM

## 2022-09-11 DIAGNOSIS — Z62892 Runaway (from current living environment): Secondary | ICD-10-CM

## 2022-09-11 NOTE — ED Triage Notes (Addendum)
Patient [facility] streets c/o bilateral foot pain.  Patient also states that he is intoxicated.  Called group home, they advised to call the after hours number for the legal guardian in chart. They also advised that they have filed a missing persons report on patient because he eloped from his last visit here and did not return to the group home.

## 2022-09-11 NOTE — ED Provider Notes (Signed)
Group home is here and patient is happy to see the 2 workers and is voluntarily going with them without any prompting.     Stephen Karman, MD 09/11/22 [phone]

## 2022-09-11 NOTE — ED Notes (Signed)
Group home staff at bedside to pick patient up.  Patient cheerful and cooperative and willing to leave with staff at this time.

## 2022-09-11 NOTE — ED Provider Notes (Signed)
[facility] Provider Note   CSN: [id number] Arrival date & time: 09/11/22  0109     History  Chief Complaint  Patient presents with   Foot Pain    Stephen Robinson is a 22 y.o. male.  The history is provided by the patient.  Foot Pain This is a new problem. The current episode started 3 to 5 hours ago. The problem occurs constantly. The problem has not changed since onset.Pertinent negatives include no chest pain, no abdominal pain, no headaches and no shortness of breath. The symptoms are aggravated by walking. Nothing relieves the symptoms. Stephen Robinson has tried nothing for the symptoms. The treatment provided no relief.       Home Medications Prior to Admission medications   Medication Sig Start Date End Date Taking? Authorizing Provider  cholecalciferol (VITAMIN D3) 25 MCG (1000 UNIT) tablet Take 1,000 Units by mouth daily.    [provider]  clonazePAM (KLONOPIN) 1 MG tablet Take 1 mg by mouth 2 (two) times daily.    [provider]  cloZAPine (CLOZARIL) 25 MG tablet Take 75 mg by mouth every morning.    [provider]  clozapine (CLOZARIL) 50 MG tablet Take 150 mg by mouth at bedtime.    [provider]  diphenhydrAMINE (BENADRYL) 25 MG tablet Take 25-50 mg by mouth every 6 (six) hours as needed for allergies (and agitation).    [provider]  diphenhydrAMINE (BENADRYL) 50 MG capsule Take 50 mg by mouth at bedtime.    [provider]  divalproex (DEPAKOTE ER) 250 MG 24 hr tablet Take 250 mg by mouth in the morning and at bedtime. Takes with the 500 mg tablet = 750 mg    [provider]  divalproex (DEPAKOTE ER) 500 MG 24 hr tablet Take 500 mg by mouth in the morning and at bedtime. Pt takes with 250 for a total of 750mg     [provider]  fluticasone (FLONASE) 50 MCG/ACT nasal spray Place 1 spray into both nostrils daily.    [provider]  guanFACINE  (INTUNIV) 4 MG TB24 ER tablet Take 1 tablet (4 mg total) by mouth at bedtime. Patient taking differently: Take 4 mg by mouth in the morning. 02/09/21 08/21/22  Chesley Noon, MD  haloperidol (HALDOL) 10 MG tablet Take 10 mg by mouth 4 (four) times daily as needed (agitation).    [provider]  ipratropium (ATROVENT) 0.06 % nasal spray Place 2 sprays into both nostrils 2 (two) times daily as needed (Pt uses for drooling (SL)). Pt uses for drooling (SL)    [provider]  levothyroxine (SYNTHROID) 25 MCG tablet Take 1 tablet (25 mcg total) by mouth daily. 05/06/22   Rankin, Shuvon B, NP  LORazepam (ATIVAN) 2 MG tablet Take 4 mg by mouth every 6 (six) hours as needed for anxiety.    [provider]  metoprolol succinate (TOPROL-XL) 25 MG 24 hr tablet Take 12.5 mg by mouth daily.    [provider]  OLANZapine (ZYPREXA) 5 MG tablet Take 5 mg by mouth 2 (two) times daily as needed (agitation).    [provider]  polyethylene glycol (MIRALAX / GLYCOLAX) 17 g packet Take 17 g by mouth 2 (two) times daily.    [provider]  senna (SENOKOT) 8.6 MG TABS tablet Take 2 tablets by mouth in the morning and at bedtime.    [provider]  sodium chloride (OCEAN) 0.65 %  SOLN nasal spray Place 1 spray into both nostrils as needed for congestion.    [provider]      Allergies    Seroquel [quetiapine]    Review of Systems   Review of Systems  Constitutional:  Negative for fever.  HENT:  Negative for facial swelling.   Eyes:  Negative for redness.  Respiratory:  Negative for shortness of breath.   Cardiovascular:  Negative for chest pain.  Gastrointestinal:  Negative for abdominal pain.  Musculoskeletal:  Positive for arthralgias.  Neurological:  Negative for headaches.  All other systems reviewed and are negative.   Physical Exam Updated Vital Signs BP 120/80   Pulse 71   Temp 98.1 F (36.7 C)   Resp 18   Ht 6' (1.829 m)    Wt 92 kg   SpO2 98%   BMI 27.51 kg/m  Physical Exam Vitals and nursing note reviewed.  Constitutional:      General: Stephen Robinson is not in acute distress.    Appearance: Normal appearance. Stephen Robinson is well-developed. Stephen Robinson is not diaphoretic.  HENT:     Head: Normocephalic and atraumatic.     Nose: Nose normal.  Eyes:     Conjunctiva/sclera: Conjunctivae normal.     Pupils: Pupils are equal, round, and reactive to light.  Cardiovascular:     Rate and Rhythm: Normal rate and regular rhythm.     Pulses: Normal pulses.     Heart sounds: Normal heart sounds.  Pulmonary:     Effort: Pulmonary effort is normal.     Breath sounds: Normal breath sounds. No wheezing or rales.  Abdominal:     General: Bowel sounds are normal.     Palpations: Abdomen is soft.     Tenderness: There is no abdominal tenderness. There is no guarding or rebound.  Musculoskeletal:        General: Normal range of motion.     Cervical back: Normal range of motion and neck supple.  Skin:    General: Skin is warm and dry.     Capillary Refill: Capillary refill takes less than 2 seconds.  Neurological:     General: No focal deficit present.     Mental Status: Stephen Robinson is alert.     Deep Tendon Reflexes: Reflexes normal.  Psychiatric:        Thought Content: Thought content normal.     Comments: No SI or HI     ED Results / Procedures / Treatments   Labs (all labs ordered are listed, but only abnormal results are displayed) Labs Reviewed - No data to display  EKG None  Radiology DG Foot Complete Right  Result Date: 09/11/2022 CLINICAL DATA:  Right foot pain in blisters from excessive walking. EXAM: RIGHT FOOT COMPLETE - 3+ VIEW COMPARISON:  None Available. FINDINGS: There is no evidence of fracture or dislocation. There is no evidence of arthropathy or other focal bone abnormality. Moderate severity soft tissue swelling is seen along the lateral aspect of the right ankle. IMPRESSION: Moderate severity lateral right ankle  soft tissue swelling. Electronically Signed   By: Aram Candela M.D.   On: 09/11/2022 01:51    Procedures Procedures    Medications Ordered in ED Medications - No data to display  ED Course/ Medical Decision Making/ A&P                             Medical Decision Making Patient has been walking  for several hours as Stephen Robinson left his group home.  We was appearently reported missing by them   Amount and/or Complexity of Data Reviewed Radiology: ordered and independent interpretation performed.    Details: Negative XR of the foot by me  Discussion of management or test interpretation with external provider(s): Message left for legal guardian regarding discharge, no call back to me  Case d/w by home with head of the group home who will arrange safe transport back to the group home.    Risk Risk Details: Well appearing.  No signs of trauma.  No SI or HI.  There is no emergency medicine condition.  Group home is now here to pick the patient up and take him home.  Patient is stable for discharge    Final Clinical Impression(s) / ED Diagnoses Final diagnoses:  Foot pain, right   Return for intractable cough, coughing up blood, fevers > 100.4 unrelieved by medication, shortness of breath, intractable vomiting, chest pain, shortness of breath, weakness, numbness, changes in speech, facial asymmetry, abdominal pain, passing out, Inability to tolerate liquids or food, cough, altered mental status or any concerns. No signs of systemic illness or infection. The patient is nontoxic-appearing on exam and vital signs are within normal limits.  I have reviewed the triage vital signs and the nursing notes. Pertinent labs & imaging results that were available during my care of the patient were reviewed by me and considered in my medical decision making (see chart for details). After history, exam, and medical workup I feel the patient has been appropriately medically screened and is safe for discharge  home. Pertinent diagnoses were discussed with the patient. Patient was given return precautions Rx / DC Orders ED Discharge Orders     None         Delroy Ordway, MD 09/11/22 [phone]

## 2023-06-19 ENCOUNTER — Emergency Department (HOSPITAL_COMMUNITY)
Admission: EM | Admit: 2023-06-19 | Discharge: 2023-06-19 | Disposition: A | Discharge status: Home / Self Care | Payer: MEDICAID | Attending: Emergency Medicine | Admitting: Emergency Medicine

## 2023-06-19 ENCOUNTER — Encounter (HOSPITAL_COMMUNITY): Payer: Self-pay | Admitting: Emergency Medicine

## 2023-06-19 DIAGNOSIS — Z7689 Persons encountering health services in other specified circumstances: Secondary | ICD-10-CM

## 2023-06-19 DIAGNOSIS — R45851 Suicidal ideations: Secondary | ICD-10-CM | POA: Diagnosis not present

## 2023-06-19 DIAGNOSIS — F32A Depression, unspecified: Secondary | ICD-10-CM | POA: Insufficient documentation

## 2023-06-19 LAB — COMPREHENSIVE METABOLIC PANEL
ALT: 16 U/L (ref 0–44)
AST: 24 U/L (ref 15–41)
Albumin: 3.8 g/dL (ref 3.5–5.0)
Alkaline Phosphatase: 56 U/L (ref 38–126)
Anion gap: 12 (ref 5–15)
BUN: 11 mg/dL (ref 6–20)
CO2: 23 mmol/L (ref 22–32)
Calcium: 9.9 mg/dL (ref 8.9–10.3)
Chloride: 105 mmol/L (ref 98–111)
Creatinine, Ser: 0.92 mg/dL (ref 0.61–1.24)
GFR, Estimated: >60 mL/min (ref >60)
Glucose, Bld: 181 mg/dL — ABNORMAL HIGH (ref 70–99)
Potassium: 3.9 mmol/L (ref 3.5–5.1)
Sodium: 140 mmol/L (ref 135–145)
Total Bilirubin: 0.6 mg/dL (ref 0.0–1.2)
Total Protein: 7.6 g/dL (ref 6.5–8.1)

## 2023-06-19 LAB — SALICYLATE LEVEL: Salicylate Lvl: <7 mg/dL — ABNORMAL LOW (ref 7.0–30.0)

## 2023-06-19 LAB — CBC
HCT: 38.5 % — ABNORMAL LOW (ref 39.0–52.0)
Hemoglobin: 13.8 g/dL (ref 13.0–17.0)
MCH: 32.6 pg (ref 26.0–34.0)
MCHC: 35.8 g/dL (ref 30.0–36.0)
MCV: 91 fL (ref 80.0–100.0)
Platelets: 187 10*3/uL (ref 150–400)
RBC: 4.23 MIL/uL (ref 4.22–5.81)
RDW: 13 % (ref 11.5–15.5)
WBC: 8.7 10*3/uL (ref 4.0–10.5)
nRBC: 0 % (ref 0.0–0.2)

## 2023-06-19 LAB — ACETAMINOPHEN LEVEL: Acetaminophen (Tylenol), Serum: <10 ug/mL — ABNORMAL LOW (ref 10–30)

## 2023-06-19 LAB — ETHANOL: Alcohol, Ethyl (B): <10 mg/dL (ref <10)

## 2023-06-19 NOTE — Consult Note (Signed)
 [facility] Psychiatric Consult Initial  Patient Name: .Stephen Robinson  MRN: [id number]  DOB: July 01, 2000  Consult Order details:  Orders (From admission, onward)     Start     Ordered   06/19/23 1417  CONSULT TO CALL ACT TEAM       Ordering Provider: Dorthy Cooler, PA-C  Provider:  (Not yet assigned)  Question:  Reason for Consult?  Answer:  SI   06/19/23 1416             Mode of Visit: Tele-visit Virtual Statement:TELE PSYCHIATRY ATTESTATION & CONSENT As the provider for this telehealth consult, I attest that I verified the patient's identity using two separate identifiers, introduced myself to the patient, provided my credentials, disclosed my location, and performed this encounter via a HIPAA-compliant, real-time, face-to-face, two-way, interactive audio and video platform and with the full consent and agreement of the patient (or guardian as applicable.) Patient physical location: [facility]. Telehealth provider physical location: home office in state of [place].   Video start time: 1500 Video end time: 1520    Psychiatry Consult Evaluation  Service Date: June 19, 2023 LOS:  LOS: 0 days  Chief Complaint "just wanted to make sure I was okay"  Primary Psychiatric Diagnoses  Encounter for psychiatric evaluation 2.   3.    Assessment  Stephen Robinson is a 23 y.o. male admitted: Presented to the EDfor 06/19/2023 11:39 AM for concerns for a short episode of suicidal ideations today. He carries the psychiatric diagnoses of ADHD, OCD, and mild intellectual disability.  Please see plan below for detailed recommendations.   Diagnoses:  Active Hospital problems: Principal Problem:   Encounter for psychiatric assessment    Plan   ## Psychiatric Medication Recommendations:  Continue home medications, no changes made  ## Medical Decision Making Capacity: Not specifically addressed in this encounter  ## Further Work-up:  Nothing further at this time   ##  Disposition:-- There are no psychiatric contraindications to discharge at this time  ## Behavioral / Environmental: - No specific recommendations at this time.     ## Safety and Observation Level:  - Based on my clinical evaluation, I estimate the patient to be at low risk of self harm in the current setting. - At this time, we recommend  routine. This decision is based on my review of the chart including patient's history and current presentation, interview of the patient, mental status examination, and consideration of suicide risk including evaluating suicidal ideation, plan, intent, suicidal or self-harm behaviors, risk factors, and protective factors. This judgment is based on our ability to directly address suicide risk, implement suicide prevention strategies, and develop a safety plan while the patient is in the clinical setting. Please contact our team if there is a concern that risk level has changed.  CSSR Risk Category:C-SSRS RISK CATEGORY: Moderate Risk  Suicide Risk Assessment: Patient has following modifiable risk factors for suicide: social isolation, which we are addressing by encouraging socialization and therapies. Patient has following non-modifiable or demographic risk factors for suicide: male gender Patient has the following protective factors against suicide: Access to outpatient mental health care, Supportive family, Supportive friends, and Cultural, spiritual, or religious beliefs that discourage suicide  Thank you for this consult request. Recommendations have been communicated to the primary team.  We will psych clear for discharge at this time.   Stephen Bridegroom, NP       History of Present Illness  Relevant Aspects of Hospital ED Course:  [name]  [name] is a 23 y.o. male with PMHx ADHD, OCD, mild intellectual disability who presents to ED concerned for a short episode of SI earlier today. Patient stating that he woke up this morning feeling a little  depressed. He then had a <49min episode of SI, so he came to the ED. Patient denies plan or AVH. Patient stating that this was a very short episode and that he feels better and does not endorse SI anymore. Patient asking to go home. Patient declines any infectious symptoms today.   Upon discharge, patient stating that he changes his mind and he would like to see a mental health provider to "be safe".  Patient Report:  Patient seen at [facility] for psychiatric evaluation.  Upon assessment patient is sitting on his bed, appears to be in no acute distress.  Patient stated " I feel better but I think I need to stay here for a couple days."  When asked why patient thinks he needs to stay for couples he states "just to be sure."   Patient does have history of intellectual disability, and is difficult to have meaningful engage conversation.  Patient will to deny any suicidal or homicidal ideations.  Denies AVH.  Patient is not sure what made him upset this morning that caused him to have passive SI.  Denies any suicidal ideations at this moment.  Patient stated he would be fine to discharge as long as his caretaker takes him to [place]  I spoke with his caretaker, Stephen Robinson, at [phone].  She feels like the patient is appropriate for discharge at this time.  She states the patient is obsessed with hospitals and police officers and he often will ask to go or stay in hospitals.  She does not feel like to stay in the hospital would be beneficial at this time.  She does not have any acute safety concerns for the patient discharging.  Appropriate safety plans are in place, she is aware of mental health resources in the community, and return precautions were discussed.  I wrote already    Review of Systems  Psychiatric/Behavioral:  Positive for depression.   All other systems reviewed and are negative.    Psychiatric and Social History  Psychiatric History:  Information collected from patient,  chart  Prev Dx/Sx: OCD, ADHD Current Psych Provider: yes Home Meds (current): see mar Previous Med Trials: unknown Therapy: yes  Prior Self Harm: denies Prior Violence: hx of aggressive behaviors   Social History:  Developmental Hx: mild intellectual disabiity  Access to weapons/lethal means: denies   Substance History Alcohol: denies  Tobacco: denies Illicit drugs: denies Prescription drug abuse: denies Rehab hx: denies  Exam Findings  Physical Exam:  Vital Signs:  Temp:  [97.3 F (36.3 C)-98 F (36.7 C)] 98 F (36.7 C) (03/16 1349) Pulse Rate:  [101-130] 101 (03/16 1349) Resp:  [16-18] 18 (03/16 1349) BP: (121-128)/(84-89) 128/89 (03/16 1349) SpO2:  [98 %-100 %] 100 % (03/16 1349) Blood pressure 128/89, pulse (!) 101, temperature 98 F (36.7 C), temperature source Axillary, resp. rate 18, SpO2 100%. There is no height or weight on file to calculate BMI.  Physical Exam Vitals and nursing note reviewed.  Neurological:     Mental Status: He is alert and oriented to person, place, and time.     Mental Status Exam: General Appearance: Fairly Groomed  Orientation:  Full (Time, Place, and Person)  Memory:  Immediate;   Fair Recent;   Fair  Concentration:  Concentration: Fair  Recall:  Fair  Attention  Good  Eye Contact:  Good  Speech:  Normal Rate  Language:  Fair  Volume:  Normal  Mood: "good"  Affect:  Appropriate  Thought Process:  Coherent  Thought Content:  WDL  Suicidal Thoughts:  No  Homicidal Thoughts:  No  Judgement:  Fair  Insight:  Fair  Psychomotor Activity:  Normal  Akathisia:  No  Fund of Knowledge:  Fair      Assets:  Communication Skills Desire for Improvement Physical Health Social Support  Cognition:  WNL  ADL's:  Intact  AIMS (if indicated):        Other History   These have been pulled in through the EMR, reviewed, and updated if appropriate.  Family History:  The patient's family history is not on file.  Medical  History: Past Medical History:  Diagnosis Date   ADHD    OCD (obsessive compulsive disorder)     Surgical History: History reviewed. No pertinent surgical history.   Medications:  No current facility-administered medications for this encounter.  Current Outpatient Medications:    cholecalciferol (VITAMIN D3) 25 MCG (1000 UNIT) tablet, Take 1,000 Units by mouth daily., Disp: , Rfl:    clonazePAM (KLONOPIN) 1 MG tablet, Take 1 mg by mouth 2 (two) times daily., Disp: , Rfl:    cloZAPine (CLOZARIL) 25 MG tablet, Take 75 mg by mouth every morning., Disp: , Rfl:    clozapine (CLOZARIL) 50 MG tablet, Take 150 mg by mouth at bedtime., Disp: , Rfl:    diphenhydrAMINE (BENADRYL) 25 MG tablet, Take 25-50 mg by mouth every 6 (six) hours as needed for allergies (and agitation)., Disp: , Rfl:    diphenhydrAMINE (BENADRYL) 50 MG capsule, Take 50 mg by mouth at bedtime., Disp: , Rfl:    divalproex (DEPAKOTE ER) 250 MG 24 hr tablet, Take 250 mg by mouth in the morning and at bedtime. Takes with the 500 mg tablet = 750 mg, Disp: , Rfl:    divalproex (DEPAKOTE ER) 500 MG 24 hr tablet, Take 500 mg by mouth in the morning and at bedtime. Pt takes with 250 for a total of 750mg , Disp: , Rfl:    fluticasone (FLONASE) 50 MCG/ACT nasal spray, Place 1 spray into both nostrils daily., Disp: , Rfl:    guanFACINE (INTUNIV) 4 MG TB24 ER tablet, Take 1 tablet (4 mg total) by mouth at bedtime. (Patient taking differently: Take 4 mg by mouth in the morning.), Disp: 30 tablet, Rfl: 2   haloperidol (HALDOL) 10 MG tablet, Take 10 mg by mouth 4 (four) times daily as needed (agitation)., Disp: , Rfl:    ipratropium (ATROVENT) 0.06 % nasal spray, Place 2 sprays into both nostrils 2 (two) times daily as needed (Pt uses for drooling (SL)). Pt uses for drooling (SL), Disp: , Rfl:    levothyroxine (SYNTHROID) 25 MCG tablet, Take 1 tablet (25 mcg total) by mouth daily., Disp: , Rfl:    LORazepam (ATIVAN) 2 MG tablet, Take 4 mg by  mouth every 6 (six) hours as needed for anxiety., Disp: , Rfl:    metoprolol succinate (TOPROL-XL) 25 MG 24 hr tablet, Take 12.5 mg by mouth daily., Disp: , Rfl:    OLANZapine (ZYPREXA) 5 MG tablet, Take 5 mg by mouth 2 (two) times daily as needed (agitation)., Disp: , Rfl:    polyethylene glycol (MIRALAX / GLYCOLAX) 17 g packet, Take 17 g by mouth 2 (two) times daily., Disp: , Rfl:    senna (  SENOKOT) 8.6 MG TABS tablet, Take 2 tablets by mouth in the morning and at bedtime., Disp: , Rfl:    sodium chloride (OCEAN) 0.65 % SOLN nasal spray, Place 1 spray into both nostrils as needed for congestion., Disp: , Rfl:   Allergies: Allergies  Allergen Reactions   Seroquel [Quetiapine] Swelling    Stephen Bridegroom, NP

## 2023-06-19 NOTE — Discharge Instructions (Signed)

## 2023-06-19 NOTE — ED Provider Notes (Signed)
 [place] EMERGENCY DEPARTMENT AT [facility] Provider Note   CSN: [id number] Arrival date & time: 06/19/23  1132     History  Chief Complaint  Patient presents with   Suicidal    Stephen Robinson is a 23 y.o. male with PMHx ADHD, OCD, mild intellectual disability who presents to ED concerned for a short episode of SI earlier today. Patient stating that he woke up this morning feeling a little depressed. He then had a <15min episode of SI, so he came to the ED. Patient denies plan or AVH. Patient stating that this was a very short episode and that he feels better and does not endorse SI anymore. Patient asking to go home. Patient declines any infectious symptoms today.  Upon discharge, patient stating that he changes his mind and he would like to see a mental health provider to "be safe".  HPI     Home Medications Prior to Admission medications   Medication Sig Start Date End Date Taking? Authorizing Provider  cholecalciferol (VITAMIN D3) 25 MCG (1000 UNIT) tablet Take 1,000 Units by mouth daily.    [provider]  clonazePAM (KLONOPIN) 1 MG tablet Take 1 mg by mouth 2 (two) times daily.    [provider]  cloZAPine (CLOZARIL) 25 MG tablet Take 75 mg by mouth every morning.    [provider]  clozapine (CLOZARIL) 50 MG tablet Take 150 mg by mouth at bedtime.    [provider]  diphenhydrAMINE (BENADRYL) 25 MG tablet Take 25-50 mg by mouth every 6 (six) hours as needed for allergies (and agitation).    [provider]  diphenhydrAMINE (BENADRYL) 50 MG capsule Take 50 mg by mouth at bedtime.    [provider]  divalproex (DEPAKOTE ER) 250 MG 24 hr tablet Take 250 mg by mouth in the morning and at bedtime. Takes with the 500 mg tablet = 750 mg    [provider]  divalproex (DEPAKOTE ER) 500 MG 24 hr tablet Take 500 mg by mouth in the morning and at bedtime. Pt takes with 250 for a total of 750mg      [provider]  fluticasone (FLONASE) 50 MCG/ACT nasal spray Place 1 spray into both nostrils daily.    [provider]  guanFACINE (INTUNIV) 4 MG TB24 ER tablet Take 1 tablet (4 mg total) by mouth at bedtime. Patient taking differently: Take 4 mg by mouth in the morning. 02/09/21 08/21/22  Chesley Noon, MD  haloperidol (HALDOL) 10 MG tablet Take 10 mg by mouth 4 (four) times daily as needed (agitation).    [provider]  ipratropium (ATROVENT) 0.06 % nasal spray Place 2 sprays into both nostrils 2 (two) times daily as needed (Pt uses for drooling (SL)). Pt uses for drooling (SL)    [provider]  levothyroxine (SYNTHROID) 25 MCG tablet Take 1 tablet (25 mcg total) by mouth daily. 05/06/22   Rankin, Shuvon B, NP  LORazepam (ATIVAN) 2 MG tablet Take 4 mg by mouth every 6 (six) hours as needed for anxiety.    [provider]  metoprolol succinate (TOPROL-XL) 25 MG 24 hr tablet Take 12.5 mg by mouth daily.    [provider]  OLANZapine (ZYPREXA) 5 MG tablet Take 5 mg by mouth 2 (two) times daily as needed (agitation).    [provider]  polyethylene glycol (MIRALAX / GLYCOLAX) 17 g packet Take 17 g by mouth 2 (two) times daily.    [provider]  senna (SENOKOT) 8.6 MG TABS tablet Take 2 tablets by mouth in the morning and at bedtime.    [provider]  sodium chloride (OCEAN) 0.65 % SOLN nasal spray Place 1 spray into both nostrils as needed for congestion.    [provider]      Allergies    Seroquel [quetiapine]    Review of Systems   Review of Systems  Psychiatric/Behavioral:  Positive for suicidal ideas.     Physical Exam Updated Vital Signs BP 128/89 (BP Location: Left Arm)   Pulse (!) 101   Temp 98 F (36.7 C) (Axillary)   Resp 18   SpO2 100%  Physical Exam Vitals and nursing note reviewed.  Constitutional:      General: He is not in acute distress.    Appearance: He is not  ill-appearing or toxic-appearing.  HENT:     Head: Normocephalic and atraumatic.     Mouth/Throat:     Mouth: Mucous membranes are moist.  Eyes:     General: No scleral icterus.       Right eye: No discharge.        Left eye: No discharge.     Conjunctiva/sclera: Conjunctivae normal.  Cardiovascular:     Rate and Rhythm: Normal rate and regular rhythm.     Pulses: Normal pulses.     Heart sounds: Normal heart sounds. No murmur heard. Pulmonary:     Effort: Pulmonary effort is normal. No respiratory distress.     Breath sounds: Normal breath sounds. No wheezing, rhonchi or rales.  Abdominal:     General: Abdomen is flat. Bowel sounds are normal. There is no distension.     Palpations: Abdomen is soft. There is no mass.     Tenderness: There is no abdominal tenderness.  Musculoskeletal:     Right lower leg: No edema.     Left lower leg: No edema.  Skin:    General: Skin is warm and dry.     Findings: No rash.  Neurological:     General: No focal deficit present.     Mental Status: He is alert. Mental status is at baseline.     Comments: AAOx3. Speaks with normal affect. No responding to internal stimuli. Overall well-appearing.  Psychiatric:        Mood and Affect: Mood normal.        Behavior: Behavior normal.     ED Results / Procedures / Treatments   Labs (all labs ordered are listed, but only abnormal results are displayed) Labs Reviewed  COMPREHENSIVE METABOLIC PANEL - Abnormal; Notable for the following components:      Result Value   Glucose, Bld 181 (*)    All other components within normal limits  SALICYLATE LEVEL - Abnormal; Notable for the following components:   Salicylate Lvl <7.0 (*)    All other components within normal limits  ACETAMINOPHEN LEVEL - Abnormal; Notable for the following components:   Acetaminophen (Tylenol), Serum <10 (*)    All other components within normal limits  CBC - Abnormal; Notable for the following components:   HCT 38.5 (*)     All other components within normal limits  ETHANOL  RAPID URINE DRUG SCREEN, HOSP PERFORMED    EKG None  Radiology No results found.  Procedures Procedures    Medications Ordered in ED Medications - No data to display  ED Course/ Medical Decision Making/ A&P  Medical Decision Making Amount and/or Complexity of Data Reviewed Labs: ordered.   This patient presents to the ED for psych evaluation, this involves an extensive number of treatment options, and is a complaint that carries with it a high risk of complications and morbidity.  The differential diagnosis includes primary psychosis, substance-induced psychosis, mood disturbance, SI/HI.   Co morbidities that complicate the patient evaluation  ADHD, OCD, mild intellectual disability   Additional history obtained:  PCP not in system.   Lab Tests:  I Ordered, and personally interpreted labs.  The pertinent results include:   -Acetaminophen/salicylate levels: Within normal limits -CBC: No leukocytosis or anemia -EtOH: Within normal limits -CMP: Hyperglycemia at 181, otherwise unremarkable   Problem List / ED Course / Critical interventions / Medication management  Patient presents to ED for short lived episode of SI without plan. Denies HI and AVH. Denies any recent infectious symptoms. Patient was given food and drink and states that he feels a lot better and wants to go home. Upon discharge, patient stating that he changes his mind and would like to talk to a mental health provider. Patient with very mild tachycardia - currently around 101BPM which I believe is d/t mild dehydration given that patient has no infectious symptoms today. Will provide patient with water to drink. On my initial exam, the pt was linear in thought, appropriate in affect, and overall well-appearing. Vital signs reviewed and reassuring. With the patient's presentation of SI, patient warrants emergent  psychiatric consultation.  Patient immediately placed into ED psychiatric hold protocol. TTS consulted for further evaluation once patient medically cleared. Medical screening evaluation ordered and reviewed with no obvious medical reason to postpone psychiatric evaluation. Patient is voluntary at this time. I do not see a need for IVC as patient's symptoms were vague and short lived earlier today. Dispo pending psychiatric consultation.   Social Determinants of Health:  Legal guardian            Final Clinical Impression(s) / ED Diagnoses Final diagnoses:  Suicidal ideation    Rx / DC Orders ED Discharge Orders     None         Dorthy Cooler, [place] 06/19/23 1503    Terald Sleeper, MD 06/19/23 [phone]

## 2023-06-19 NOTE — ED Provider Notes (Signed)
 Patient seen by telepsych and at this time they feel that patient is stable for discharge.  He is here with his caregiver is calm and cooperative and at this time is not thought to be a threat to himself or others.   Gwyneth Sprout, MD 06/19/23 [phone]

## 2023-06-19 NOTE — ED Notes (Signed)
 Pt plans on staying

## 2023-06-19 NOTE — ED Notes (Signed)
 Pt came to nurses station and stated "am I allowed to sign myself out". This NT notified pt if you are not IVC'd. Pt stated "I plan on signing myself out in an hour". Notified physician.

## 2023-06-19 NOTE — ED Triage Notes (Signed)
 Pt here from outside/homeless  with c/o SI, hx of same ,

## 2023-06-21 ENCOUNTER — Emergency Department (HOSPITAL_COMMUNITY): Payer: MEDICAID

## 2023-06-21 ENCOUNTER — Other Ambulatory Visit: Payer: Self-pay

## 2023-06-21 ENCOUNTER — Encounter (HOSPITAL_COMMUNITY): Payer: Self-pay | Admitting: Emergency Medicine

## 2023-06-21 ENCOUNTER — Emergency Department (HOSPITAL_COMMUNITY)
Admission: EM | Admit: 2023-06-21 | Discharge: 2023-06-21 | Disposition: A | Discharge status: Home / Self Care | Payer: MEDICAID | Attending: Emergency Medicine | Admitting: Emergency Medicine

## 2023-06-21 DIAGNOSIS — R000 Tachycardia, unspecified: Secondary | ICD-10-CM | POA: Diagnosis not present

## 2023-06-21 DIAGNOSIS — K529 Noninfective gastroenteritis and colitis, unspecified: Secondary | ICD-10-CM | POA: Insufficient documentation

## 2023-06-21 DIAGNOSIS — R109 Unspecified abdominal pain: Secondary | ICD-10-CM | POA: Diagnosis present

## 2023-06-21 LAB — CBC WITH DIFFERENTIAL/PLATELET
Abs Immature Granulocytes: 0.05 10*3/uL (ref 0.00–0.07)
Basophils Absolute: 0 10*3/uL (ref 0.0–0.1)
Basophils Relative: 0 %
Eosinophils Absolute: 0.2 10*3/uL (ref 0.0–0.5)
Eosinophils Relative: 1 %
HCT: 43 % (ref 39.0–52.0)
Hemoglobin: 15.5 g/dL (ref 13.0–17.0)
Immature Granulocytes: 0 %
Lymphocytes Relative: 5 %
Lymphs Abs: 0.7 10*3/uL (ref 0.7–4.0)
MCH: 32.8 pg (ref 26.0–34.0)
MCHC: 36 g/dL (ref 30.0–36.0)
MCV: 91.1 fL (ref 80.0–100.0)
Monocytes Absolute: 0.9 10*3/uL (ref 0.1–1.0)
Monocytes Relative: 6 %
Neutro Abs: 12.3 10*3/uL — ABNORMAL HIGH (ref 1.7–7.7)
Neutrophils Relative %: 88 %
Platelets: 167 10*3/uL (ref 150–400)
RBC: 4.72 MIL/uL (ref 4.22–5.81)
RDW: 13 % (ref 11.5–15.5)
WBC: 14.1 10*3/uL — ABNORMAL HIGH (ref 4.0–10.5)
nRBC: 0 % (ref 0.0–0.2)

## 2023-06-21 LAB — URINALYSIS, ROUTINE W REFLEX MICROSCOPIC
Bacteria, UA: NONE SEEN
Bilirubin Urine: NEGATIVE
Glucose, UA: NEGATIVE mg/dL
Hgb urine dipstick: NEGATIVE
Ketones, ur: 5 mg/dL — AB
Leukocytes,Ua: NEGATIVE
Nitrite: NEGATIVE
Protein, ur: 30 mg/dL — AB
Specific Gravity, Urine: 1.028 (ref 1.005–1.030)
pH: 5 (ref 5.0–8.0)

## 2023-06-21 LAB — RAPID URINE DRUG SCREEN, HOSP PERFORMED
Amphetamines: NOT DETECTED
Barbiturates: NOT DETECTED
Benzodiazepines: POSITIVE — AB
Cocaine: NOT DETECTED
Opiates: NOT DETECTED
Tetrahydrocannabinol: NOT DETECTED

## 2023-06-21 LAB — COMPREHENSIVE METABOLIC PANEL
ALT: 15 U/L (ref 0–44)
AST: 23 U/L (ref 15–41)
Albumin: 4 g/dL (ref 3.5–5.0)
Alkaline Phosphatase: 61 U/L (ref 38–126)
Anion gap: 13 (ref 5–15)
BUN: 11 mg/dL (ref 6–20)
CO2: 23 mmol/L (ref 22–32)
Calcium: 9.5 mg/dL (ref 8.9–10.3)
Chloride: 103 mmol/L (ref 98–111)
Creatinine, Ser: 0.9 mg/dL (ref 0.61–1.24)
GFR, Estimated: >60 mL/min (ref >60)
Glucose, Bld: 146 mg/dL — ABNORMAL HIGH (ref 70–99)
Potassium: 3.9 mmol/L (ref 3.5–5.1)
Sodium: 139 mmol/L (ref 135–145)
Total Bilirubin: 0.6 mg/dL (ref 0.0–1.2)
Total Protein: 7.7 g/dL (ref 6.5–8.1)

## 2023-06-21 LAB — LIPASE, BLOOD: Lipase: 32 U/L (ref 11–51)

## 2023-06-21 LAB — TSH: TSH: 3.35 u[IU]/mL (ref 0.350–4.500)

## 2023-06-21 MED ORDER — LORAZEPAM 2 MG/ML IJ SOLN
0.5000 mg | Freq: Once | INTRAMUSCULAR | Status: AC
  Administered 2023-06-21: 0.5 mg via INTRAVENOUS
  Filled 2023-06-21: qty 1

## 2023-06-21 MED ORDER — SODIUM CHLORIDE 0.9 % IV BOLUS
1000.0000 mL | Freq: Once | INTRAVENOUS | Status: AC
  Administered 2023-06-21: 1000 mL via INTRAVENOUS

## 2023-06-21 MED ORDER — SODIUM CHLORIDE 0.9 % IV BOLUS
1000.0000 mL | Freq: Once | INTRAVENOUS | Status: DC
Start: 2023-06-21 — End: 2023-06-21

## 2023-06-21 MED ORDER — LACTATED RINGERS IV BOLUS
2000.0000 mL | Freq: Once | INTRAVENOUS | Status: AC
  Administered 2023-06-21: 2000 mL via INTRAVENOUS

## 2023-06-21 MED ORDER — ONDANSETRON 4 MG PO TBDP: 4.0000 mg | ORAL_TABLET | Freq: Once | ORAL | Status: DC

## 2023-06-21 MED ORDER — FAMOTIDINE IN NACL 20-0.9 MG/50ML-% IV SOLN
20.0000 mg | Freq: Once | INTRAVENOUS | Status: AC
  Administered 2023-06-21: 20 mg via INTRAVENOUS
  Filled 2023-06-21: qty 50

## 2023-06-21 MED ORDER — IOHEXOL 350 MG/ML SOLN
75.0000 mL | Freq: Once | INTRAVENOUS | Status: AC | PRN
  Administered 2023-06-21: 75 mL via INTRAVENOUS

## 2023-06-21 NOTE — ED Triage Notes (Addendum)
 Patient reports generalized abdominal pain associated with nausea, vomiting, and diarrhea since last night. Denies blood in vomit or stool. Denies fevers at home. Legal guardian at bedside.

## 2023-06-21 NOTE — ED Notes (Signed)
 Notified Fayrene Helper, PA of pt's hypotension and obtained orders.

## 2023-06-21 NOTE — ED Provider Notes (Signed)
 Received signout from previous provider, please see her note for complete H&P.  This is a 23 year old male who presents with complaints of nausea and vomiting for the past 1 day.  He does have history of intellectual disability and is accompanied by her legal guardian.  Patient has had head weight loss in the past month  -Labs ordered, independently viewed and interpreted by me.  Labs remarkable for white blood count of 14.1. -The patient was maintained on a cardiac monitor.  I personally viewed and interpreted the cardiac monitored which showed an underlying rhythm of: sinus tachycardia -Imaging independently viewed and interpreted by me and I agree with radiologist's interpretation.  Result remarkable for CT scan of the abdomen pelvis showing findings suggestive of enteritis/colitis. -This patient presents to the ED for concern of abd pain, this involves an extensive number of treatment options, and is a complaint that carries with it a high risk of complications and morbidity.  The differential diagnosis includes gastritis, GERD, cholecystitis, colitis, diverticulitis, appendicitis, pancreatitis, urinary tract infection, gastroenteritis -Co morbidities that complicate the patient evaluation includes intellectual disability, ADD, OCD -Treatment includes IV fluid, Pepcid, Ativan -Reevaluation of the patient after these medicines showed that the patient improved -PCP office notes or outside notes reviewed -attempted to contact legal guardian without success -Escalation to admission/observation considered: patients feels much better, is comfortable with discharge, and will follow up with PCP -Prescription medication considered, patient comfortable with OTC meds -Social Determinant of Health considered which includes intellectual disability  BP 102/73   Pulse 100   Temp 100 F (37.8 C) (Rectal)   Resp 19   Ht 6' (1.829 m)   Wt 72.6 kg   SpO2 100%   BMI 21.70 kg/m   Results for orders  placed or performed during the hospital encounter of 06/21/23  Rapid urine drug screen (hospital performed)   Collection Time: 06/21/23  3:13 AM  Result Value Ref Range   Opiates NONE DETECTED NONE DETECTED   Cocaine NONE DETECTED NONE DETECTED   Benzodiazepines POSITIVE (A) NONE DETECTED   Amphetamines NONE DETECTED NONE DETECTED   Tetrahydrocannabinol NONE DETECTED NONE DETECTED   Barbiturates NONE DETECTED NONE DETECTED  Urinalysis, Routine w reflex microscopic -Urine, Clean Catch   Collection Time: 06/21/23  3:13 AM  Result Value Ref Range   Color, Urine AMBER (A) YELLOW   APPearance HAZY (A) CLEAR   Specific Gravity, Urine 1.028 1.005 - 1.030   pH 5.0 5.0 - 8.0   Glucose, UA NEGATIVE NEGATIVE mg/dL   Hgb urine dipstick NEGATIVE NEGATIVE   Bilirubin Urine NEGATIVE NEGATIVE   Ketones, ur 5 (A) NEGATIVE mg/dL   Protein, ur 30 (A) NEGATIVE mg/dL   Nitrite NEGATIVE NEGATIVE   Leukocytes,Ua NEGATIVE NEGATIVE   RBC / HPF 0-5 0 - 5 RBC/hpf   WBC, UA 6-10 0 - 5 WBC/hpf   Bacteria, UA NONE SEEN NONE SEEN   Squamous Epithelial / HPF 0-5 0 - 5 /HPF   Mucus PRESENT    Hyaline Casts, UA PRESENT   TSH   Collection Time: 06/21/23  3:15 AM  Result Value Ref Range   TSH 3.350 0.350 - 4.500 uIU/mL  CBC with Differential   Collection Time: 06/21/23  3:16 AM  Result Value Ref Range   WBC 14.1 (H) 4.0 - 10.5 K/uL   RBC 4.72 4.22 - 5.81 MIL/uL   Hemoglobin 15.5 13.0 - 17.0 g/dL   HCT 95.6 38.7 - 56.4 %   MCV 91.1 80.0 -  100.0 fL   MCH 32.8 26.0 - 34.0 pg   MCHC 36.0 30.0 - 36.0 g/dL   RDW 27.2 53.6 - 64.4 %   Platelets 167 150 - 400 K/uL   nRBC 0.0 0.0 - 0.2 %   Neutrophils Relative % 88 %   Neutro Abs 12.3 (H) 1.7 - 7.7 K/uL   Lymphocytes Relative 5 %   Lymphs Abs 0.7 0.7 - 4.0 K/uL   Monocytes Relative 6 %   Monocytes Absolute 0.9 0.1 - 1.0 K/uL   Eosinophils Relative 1 %   Eosinophils Absolute 0.2 0.0 - 0.5 K/uL   Basophils Relative 0 %   Basophils Absolute 0.0 0.0 - 0.1 K/uL    Immature Granulocytes 0 %   Abs Immature Granulocytes 0.05 0.00 - 0.07 K/uL  Comprehensive metabolic panel   Collection Time: 06/21/23  3:16 AM  Result Value Ref Range   Sodium 139 135 - 145 mmol/L   Potassium 3.9 3.5 - 5.1 mmol/L   Chloride 103 98 - 111 mmol/L   CO2 23 22 - 32 mmol/L   Glucose, Bld 146 (H) 70 - 99 mg/dL   BUN 11 6 - 20 mg/dL   Creatinine, Ser 0.34 0.61 - 1.24 mg/dL   Calcium 9.5 8.9 - 74.2 mg/dL   Total Protein 7.7 6.5 - 8.1 g/dL   Albumin 4.0 3.5 - 5.0 g/dL   AST 23 15 - 41 U/L   ALT 15 0 - 44 U/L   Alkaline Phosphatase 61 38 - 126 U/L   Total Bilirubin 0.6 0.0 - 1.2 mg/dL   GFR, Estimated >59 >56 mL/min   Anion gap 13 5 - 15  Lipase, blood   Collection Time: 06/21/23  3:16 AM  Result Value Ref Range   Lipase 32 11 - 51 U/L   CT ABDOMEN PELVIS W CONTRAST Result Date: 06/21/2023 CLINICAL DATA:  Vomiting and diarrhea. EXAM: CT ABDOMEN AND PELVIS WITH CONTRAST TECHNIQUE: Multidetector CT imaging of the abdomen and pelvis was performed using the standard protocol following bolus administration of intravenous contrast. RADIATION DOSE REDUCTION: This exam was performed according to the departmental dose-optimization program which includes automated exposure control, adjustment of the mA and/or kV according to patient size and/or use of iterative reconstruction technique. CONTRAST:  75mL OMNIPAQUE IOHEXOL 350 MG/ML SOLN COMPARISON:  None Available. FINDINGS: Lower chest: No acute findings. Hepatobiliary: No suspicious focal abnormality within the liver parenchyma. There is no evidence for gallstones, gallbladder wall thickening, or pericholecystic fluid. No intrahepatic or extrahepatic biliary dilation. Pancreas: No focal mass lesion. No dilatation of the main duct. No intraparenchymal cyst. No peripancreatic edema. Spleen: No splenomegaly. No suspicious focal mass lesion. Adrenals/Urinary Tract: No adrenal nodule or mass. Kidneys unremarkable. No evidence for hydroureter.  Although bladder is under distended, there may be mild irregular circumferential bladder wall thickening (86/3). Stomach/Bowel: Stomach is unremarkable. No gastric wall thickening. No evidence of outlet obstruction. Duodenum is moderately distended with fluid. Small bowel loops are diffusely fluid-filled in the abdomen although less so in the pelvis. No small bowel wall thickening. The terminal ileum is normal. The appendix is not well visualized, but there is no edema or inflammation in the region of the cecal tip to suggest appendicitis. Colon is diffusely decompressed to the sigmoid segment which accentuates wall thickness. Some circumferential wall thickness in the rectum cannot be excluded. There is no substantial edema or inflammation in the mesentery. No intraperitoneal free fluid. Vascular/Lymphatic: No abdominal aortic aneurysm. No abdominal aortic atherosclerotic calcification.  There is no gastrohepatic or hepatoduodenal ligament lymphadenopathy. No retroperitoneal or mesenteric lymphadenopathy. No pelvic sidewall lymphadenopathy. Reproductive: The prostate gland and seminal vesicles are unremarkable. Other: No intraperitoneal free fluid. Musculoskeletal: No worrisome lytic or sclerotic osseous abnormality. IMPRESSION: 1. Diffusely fluid-filled small bowel loops in the abdomen although less so in the pelvis. No small bowel wall thickening. Imaging features are nonspecific but can be seen in the setting of enteritis. 2. Colon is diffusely decompressed to the sigmoid segment which accentuates wall thickness. Some circumferential wall thickness in the rectum cannot be excluded. Correlation for signs/symptoms of colitis recommended. 3. Although bladder is under distended, there may be mild irregular circumferential bladder wall thickening. Correlation with urinalysis recommended to exclude cystitis. Electronically Signed   By: Kennith Center M.D.   On: 06/21/2023 05:30      Fayrene Helper, PA-C 06/21/23  1008    Melene Plan, DO 06/21/23 1039

## 2023-06-21 NOTE — Discharge Instructions (Signed)
 You have been evaluated for your symptoms.  Your symptoms are consistent with a viral gastroenteritis.  Try to stay hydrated by drinking small amount of fluid until symptoms improve before advance to normal diet.  Follow-up with your doctor for further care.

## 2023-06-21 NOTE — ED Provider Notes (Signed)
 [place] EMERGENCY DEPARTMENT AT [facility] Provider Note   CSN: [id number] Arrival date & time: 06/21/23  0303     History  Chief Complaint  Patient presents with   Abdominal Pain    Stephen Robinson is a 23 y.o. male.  23 y/o male presents to the ED for evaluation of V/D which began acutely at 0130. Numerous episodes of NB/NB emesis. Tried drinking gatorade, but unable to tolerate it. In normal state of health at bedtime. C/o some aching pain to mid back. Guardian states 30 pound weight loss in the past 3 months; however, recurrent N/V not seemingly a contributing factor. No fevers or sick contacts. No prior abdominal surgeries.   Abdominal Pain      Home Medications Prior to Admission medications   Medication Sig Start Date End Date Taking? Authorizing Provider  cholecalciferol (VITAMIN D3) 25 MCG (1000 UNIT) tablet Take 1,000 Units by mouth daily.    [provider]  clonazePAM (KLONOPIN) 1 MG tablet Take 1 mg by mouth 2 (two) times daily.    [provider]  cloZAPine (CLOZARIL) 25 MG tablet Take 75 mg by mouth every morning.    [provider]  clozapine (CLOZARIL) 50 MG tablet Take 150 mg by mouth at bedtime.    [provider]  diphenhydrAMINE (BENADRYL) 25 MG tablet Take 25-50 mg by mouth every 6 (six) hours as needed for allergies (and agitation).    [provider]  diphenhydrAMINE (BENADRYL) 50 MG capsule Take 50 mg by mouth at bedtime.    [provider]  divalproex (DEPAKOTE ER) 250 MG 24 hr tablet Take 250 mg by mouth in the morning and at bedtime. Takes with the 500 mg tablet = 750 mg    [provider]  divalproex (DEPAKOTE ER) 500 MG 24 hr tablet Take 500 mg by mouth in the morning and at bedtime. Pt takes with 250 for a total of 750mg     [provider]  fluticasone (FLONASE) 50 MCG/ACT nasal spray Place 1 spray into both nostrils daily.    [provider]   guanFACINE (INTUNIV) 4 MG TB24 ER tablet Take 1 tablet (4 mg total) by mouth at bedtime. Patient taking differently: Take 4 mg by mouth in the morning. 02/09/21 08/21/22  Chesley Noon, MD  haloperidol (HALDOL) 10 MG tablet Take 10 mg by mouth 4 (four) times daily as needed (agitation).    [provider]  ipratropium (ATROVENT) 0.06 % nasal spray Place 2 sprays into both nostrils 2 (two) times daily as needed (Pt uses for drooling (SL)). Pt uses for drooling (SL)    [provider]  levothyroxine (SYNTHROID) 25 MCG tablet Take 1 tablet (25 mcg total) by mouth daily. 05/06/22   Rankin, Shuvon B, NP  LORazepam (ATIVAN) 2 MG tablet Take 4 mg by mouth every 6 (six) hours as needed for anxiety.    [provider]  metoprolol succinate (TOPROL-XL) 25 MG 24 hr tablet Take 12.5 mg by mouth daily.    [provider]  OLANZapine (ZYPREXA) 5 MG tablet Take 5 mg by mouth 2 (two) times daily as needed (agitation).    [provider]  polyethylene glycol (MIRALAX / GLYCOLAX) 17 g packet Take 17 g by mouth 2 (two) times daily.    [provider]  senna (SENOKOT) 8.6 MG TABS tablet Take 2 tablets by mouth in the morning and at bedtime.    [provider]  sodium chloride (OCEAN)  0.65 % SOLN nasal spray Place 1 spray into both nostrils as needed for congestion.    [provider]      Allergies    Seroquel [quetiapine]    Review of Systems   Review of Systems  Gastrointestinal:  Positive for abdominal pain.  Ten systems reviewed and are negative for acute change, except as noted in the HPI.    Physical Exam Updated Vital Signs BP 112/82   Pulse (!) 129   Temp 97.7 F (36.5 C)   Resp 16   Ht 6' (1.829 m)   Wt 72.6 kg   SpO2 98%   BMI 21.70 kg/m   Physical Exam Vitals and nursing note reviewed.  Constitutional:      General: He is not in acute distress.    Appearance: He is well-developed. He is not diaphoretic.      Comments: Nontoxic appearing. Thin frame. Holding half filled emesis bag.  HENT:     Head: Normocephalic and atraumatic.  Eyes:     General: No scleral icterus.    Conjunctiva/sclera: Conjunctivae normal.  Cardiovascular:     Rate and Rhythm: Regular rhythm. Tachycardia present.     Pulses: Normal pulses.  Pulmonary:     Effort: Pulmonary effort is normal. No respiratory distress.     Breath sounds: No stridor. No wheezing.  Abdominal:     Palpations: Abdomen is soft. There is no mass.     Tenderness: There is no guarding.     Comments: Abdomen soft, nondistended. No focal TTP.  Musculoskeletal:        General: Normal range of motion.     Cervical back: Normal range of motion.  Skin:    General: Skin is warm and dry.     Coloration: Skin is not pale.     Findings: No erythema or rash.  Neurological:     Mental Status: He is alert and oriented to person, place, and time.     Coordination: Coordination normal.  Psychiatric:        Behavior: Behavior normal.     ED Results / Procedures / Treatments   Labs (all labs ordered are listed, but only abnormal results are displayed) Labs Reviewed  RAPID URINE DRUG SCREEN, HOSP PERFORMED - Abnormal; Notable for the following components:      Result Value   Benzodiazepines POSITIVE (*)    All other components within normal limits  URINALYSIS, ROUTINE W REFLEX MICROSCOPIC - Abnormal; Notable for the following components:   Color, Urine AMBER (*)    APPearance HAZY (*)    Ketones, ur 5 (*)    Protein, ur 30 (*)    All other components within normal limits  CBC WITH DIFFERENTIAL/PLATELET - Abnormal; Notable for the following components:   WBC 14.1 (*)    Neutro Abs 12.3 (*)    All other components within normal limits  COMPREHENSIVE METABOLIC PANEL - Abnormal; Notable for the following components:   Glucose, Bld 146 (*)    All other components within normal limits  LIPASE, BLOOD  TSH    EKG EKG  Interpretation Date/Time:  Tuesday June 21 2023 03:22:34 EDT Ventricular Rate:  136 PR Interval:    QRS Duration:  84 QT Interval:  380 QTC Calculation: 571 R Axis:   121  Text Interpretation: ** Critical Test Result: Long QTc Supraventricular tachycardia Right axis deviation Cannot rule out Inferior infarct , age undetermined Abnormal ECG When compared with ECG of 19-Aug-2022 22:27, HEART RATE  has increased Confirmed by Dione Booze ([id number]) on 06/21/2023 3:27:51 AM  Radiology CT ABDOMEN PELVIS W CONTRAST Result Date: 06/21/2023 CLINICAL DATA:  Vomiting and diarrhea. EXAM: CT ABDOMEN AND PELVIS WITH CONTRAST TECHNIQUE: Multidetector CT imaging of the abdomen and pelvis was performed using the standard protocol following bolus administration of intravenous contrast. RADIATION DOSE REDUCTION: This exam was performed according to the departmental dose-optimization program which includes automated exposure control, adjustment of the mA and/or kV according to patient size and/or use of iterative reconstruction technique. CONTRAST:  75mL OMNIPAQUE IOHEXOL 350 MG/ML SOLN COMPARISON:  None Available. FINDINGS: Lower chest: No acute findings. Hepatobiliary: No suspicious focal abnormality within the liver parenchyma. There is no evidence for gallstones, gallbladder wall thickening, or pericholecystic fluid. No intrahepatic or extrahepatic biliary dilation. Pancreas: No focal mass lesion. No dilatation of the main duct. No intraparenchymal cyst. No peripancreatic edema. Spleen: No splenomegaly. No suspicious focal mass lesion. Adrenals/Urinary Tract: No adrenal nodule or mass. Kidneys unremarkable. No evidence for hydroureter. Although bladder is under distended, there may be mild irregular circumferential bladder wall thickening (86/3). Stomach/Bowel: Stomach is unremarkable. No gastric wall thickening. No evidence of outlet obstruction. Duodenum is moderately distended with fluid. Small bowel loops are  diffusely fluid-filled in the abdomen although less so in the pelvis. No small bowel wall thickening. The terminal ileum is normal. The appendix is not well visualized, but there is no edema or inflammation in the region of the cecal tip to suggest appendicitis. Colon is diffusely decompressed to the sigmoid segment which accentuates wall thickness. Some circumferential wall thickness in the rectum cannot be excluded. There is no substantial edema or inflammation in the mesentery. No intraperitoneal free fluid. Vascular/Lymphatic: No abdominal aortic aneurysm. No abdominal aortic atherosclerotic calcification. There is no gastrohepatic or hepatoduodenal ligament lymphadenopathy. No retroperitoneal or mesenteric lymphadenopathy. No pelvic sidewall lymphadenopathy. Reproductive: The prostate gland and seminal vesicles are unremarkable. Other: No intraperitoneal free fluid. Musculoskeletal: No worrisome lytic or sclerotic osseous abnormality. IMPRESSION: 1. Diffusely fluid-filled small bowel loops in the abdomen although less so in the pelvis. No small bowel wall thickening. Imaging features are nonspecific but can be seen in the setting of enteritis. 2. Colon is diffusely decompressed to the sigmoid segment which accentuates wall thickness. Some circumferential wall thickness in the rectum cannot be excluded. Correlation for signs/symptoms of colitis recommended. 3. Although bladder is under distended, there may be mild irregular circumferential bladder wall thickening. Correlation with urinalysis recommended to exclude cystitis. Electronically Signed   By: Kennith Center M.D.   On: 06/21/2023 05:30    Procedures Procedures    Medications Ordered in ED Medications  lactated ringers bolus 2,000 mL (has no administration in time range)  famotidine (PEPCID) IVPB 20 mg premix (has no administration in time range)  LORazepam (ATIVAN) injection 0.5 mg (0.5 mg Intravenous Given 06/21/23 0402)  iohexol (OMNIPAQUE)  350 MG/ML injection 75 mL (75 mLs Intravenous Contrast Given 06/21/23 0452)    ED Course/ Medical Decision Making/ A&P                                 Medical Decision Making Amount and/or Complexity of Data Reviewed Labs: ordered. Radiology: ordered. ECG/medicine tests: ordered.  Risk Prescription drug management.   This patient presents to the ED for concern of V/D, this involves an extensive number of treatment options, and is a complaint that carries with it a high risk of complications  and morbidity.  The differential diagnosis includes viral illness vs pSBO/SBO vs substance ingestion    Co morbidities that complicate the patient evaluation  OCD ADHD   Additional history obtained:  Additional history obtained from group home guardian   Lab Tests:  I Ordered, and personally interpreted labs.  The pertinent results include:  WBC 14.1, UA with mild ketonuria and proteinuria suggestive of dehydration.   Imaging Studies ordered:  I ordered imaging studies including CT abd/pelvis  I independently visualized and interpreted imaging which showed findings suggestive of enterocolitis. No mass, SBO, free air. I agree with the radiologist interpretation   Cardiac Monitoring:  The patient was maintained on a cardiac monitor.  I personally viewed and interpreted the cardiac monitored which showed an underlying rhythm of: sinus tachycardia, improving.   Medicines ordered and prescription drug management:  I ordered medication including Ativan and Pepcid for nausea/vomiting  Reevaluation of the patient after these medicines showed that the patient improved I have reviewed the patients home medicines and have made adjustments as needed   Test Considered:  Magnesium   Problem List / ED Course:  23 year old male presents to the emergency department for evaluation of vomiting and diarrhea which began acutely at 1:30 AM today.  No focal tenderness on abdominal exam, but  guardian reported 30 pound weight loss in the past 3 months which was largely unintentional.  Decision was made to proceed with abdominal CT given nonspecific leukocytosis. Imaging findings consistent with enterocolitis.  This does correlate with the patient's symptoms.  Suspect viral etiology.  He was profoundly tachycardic in triage.  This is gradually improving, though still unable to tolerate oral fluids.  Will hydrate with 2 L IV fluids and give additional medications for symptom management.  If heart rate normalizes and patient able to tolerate PO, feel outpatient management is reasonable. Care signed out to oncoming APP provider at shift change.   Reevaluation:  After the interventions noted above, I reevaluated the patient and found that they have :improved   Social Determinants of Health:  Group home resident   Dispostion:  Care signed out to [place], PA-C at shift change.         Final Clinical Impression(s) / ED Diagnoses Final diagnoses:  Enterocolitis    Rx / DC Orders ED Discharge Orders     None         Antony Madura, PA-C 06/21/23 1191    Dione Booze, MD 06/21/23 [phone]

## 2023-06-21 NOTE — ED Provider Triage Note (Addendum)
 Emergency Medicine Provider Triage Evaluation Note  Stephen Robinson , a 23 y.o. male  was evaluated in triage.  Pt complains of V/D which began acutely at 0130. Numerous episodes of NB/NB emesis. Tried drinking gatorade, but unable to tolerate it. In normal state of health at bedtime. C/o some aching pain to mid back. Guardian states 30 pound weight loss in the past 3 months; however, recurrent N/V not seemingly a contributing factor. No fevers or sick contacts. No prior abdominal surgeries.  Review of Systems  Positive: As above Negative: As above  Physical Exam  BP 111/81   Pulse (!) 147   Temp 97.6 F (36.4 C)   Resp 16   Ht 6' (1.829 m)   Wt 72.6 kg   SpO2 100%   BMI 21.70 kg/m  Gen:   Awake, no distress   Resp:  Normal effort  MSK:   Moves extremities without difficulty  Other:  Abdomen soft, nondistended. No focal TTP.  Medical Decision Making  Medically screening exam initiated at 3:39 AM.  Appropriate orders placed.  Kamryn [name] was informed that the remainder of the evaluation will be completed by another provider, this initial triage assessment does not replace that evaluation, and the importance of remaining in the ED until their evaluation is complete.  Labs initiated. Benign abdominal exam; however, with weight loss reported, will obtain CT. Zofran ordered.   Antony Madura, PA-C 06/21/23 0342  3:48 AM Zofran changed to Ativan given prolonged QTc of 571.    Antony Madura, PA-C 06/21/23 [phone]

## 2023-07-05 NOTE — Progress Notes (Addendum)
 ------------------------------------------------------------------------------- Summary: #8-IFL #9-IFL COMPOSITE -------------------------------------------------------------------------------  [facility] 07/05/23 No primary care provider on file. Treatment Providers Debby Mate, DDS  [facility] 07/05/23 No primary care provider on file. Treatment Providers Debby Mate, DDS Dental procedures in this visit  . I7667 - RESIN-BASED COMPOSITE - THREE SURFACES, ANTERIOR 8 FIL (Completed)    Service provider: Debby Mate, DDS    Billing provider: Debby Mate, DDS  . I7667 - RESIN-BASED COMPOSITE - THREE SURFACES, ANTERIOR 9 FIL (Completed)    Service provider: Debby Mate, DDS    Billing provider: Debby Mate, DDS    HEALTH HISTORY ? Vitals:  BP Readings from Last 1 Encounters:  07/05/23 116/87    Pulse:  88 Medical history was reviewed and updated. No contraindication to care. Medical History[1] Surgical History[2] Social History   Tobacco Use  . Smoking status: Never  . Smokeless tobacco: Never  Substance Use Topics  . Alcohol use: Never   Family History[3] Medications Ordered Prior to Encounter[4] Medical Risk Assessment ASA GRADE: ASA 1 - Normal health patient  Subjective: Mr./Ms. Stephen Robinson is a 23 y.o. male presenting for treatment on tooth number: #8-IFL #9-IFL                               Patient reports no change to condition since last visit  History of presenting condition: Tooth/teeth to be treated testing asymptomatic  Objective:  Radiographs taken:  of area:  All Radiographs Entry:   Assessment: Procedure:  Composite resin Direct restorations of tooth/teeth:  #8 I F L due to wear #9 Incisal L F - WEAR   Procedure risks, benefits, and concerns were explained to the patient.  A formal consent was signed after the risks were explained to the patient.  Paused to confirm correct patient, correct teeth for  treatment, and confirmed diagnosis for treatment.  Topical anesthetic (benzocaine  20%) was applied  ANESTHETIC 1:No Anesthetic required   Procedure performed under Cotton rolls isolation  Mylar strip Medicament placed prior to restoring: N/A.  Bonding agent:  Scotch bond. Tooth restored to function using composite resin. Composite resin:  Filtek Supreme Ultra Flowable shade A2. Amalgam:  NA Contacts and occlusion checked and adjusted. Restoration polished using carbides  Plan: Patient informed about possible short-term sensitivity to cold.   Provider should be contacted in the event of severe sensitivity or any sensitivity to heat and/or pain on biting. Patient will return on:  for RECARE  Treatment Providers Dental Assistant: DELON A NORMA KANDICE Debby Mate, DDS [facility] [address] [place] [address] [phone]        [1] Past Medical History: Diagnosis Date  . Bipolar disorder   [2] History reviewed. No pertinent surgical history. [3] Family History Problem Relation Name Age of Onset  . No Known Problems Mother    . No Known Problems Father    [4] Current Outpatient Medications on File Prior to Visit  Medication Sig Dispense Refill  . acetaminophen  (TYLENOL  8 HOUR) 650 mg 8 hr tablet     . atorvastatin  (LIPITOR) 40 mg tablet     . Banophen  50 mg capsule     . cholecalciferol  (VITAMIN D-3) 25 mcg (1,000 unit) tablet Take 1,000 Units by mouth.    . ClearLax 17 gram/dose powder     . clonazePAM  (KlonoPIN ) 0.5 mg tablet     .  clonazePAM  (KlonoPIN ) 0.5 mg tablet Take 1 mg by mouth.    . clonazePAM  (KlonoPIN ) 1 mg tablet     . clonazePAM  (KlonoPIN ) 1 mg tablet Take 1 mg by mouth.    . cloZAPine  (CLOZARIL ) 100 mg tablet     . cloZAPine  (CLOZARIL ) 25 mg tablet     . cloZAPine  (CLOZARIL ) 25 mg tablet Take 75 mg by mouth.    . cloZAPine  (CLOZARIL ) 50 mg tablet Take 150 mg by mouth.    . Compound W 17 % gel      . Deep Sea Nasal 0.65 % nasal spray     . diphenhydrAMINE  (BENADRYL ) 25 mg capsule     . diphenhydrAMINE  (BENADRYL ) 50 mg capsule Take 50 mg by mouth.    . diphenhydrAMINE  (SOMINEX) 25 mg tablet Take 25-50 mg by mouth.    . divalproex  (DEPAKOTE ) 250 mg 24 hr tablet     . divalproex  (DEPAKOTE ) 250 mg 24 hr tablet Take 250 mg by mouth in the morning and 250 mg in the evening.    . divalproex  (DEPAKOTE ) 250 mg EC tablet     . divalproex  (DEPAKOTE ) 500 mg 24 hr tablet     . divalproex  (DEPAKOTE ) 500 mg 24 hr tablet Take 500 mg by mouth in the morning and 500 mg in the evening.    . fluticasone  propionate (FLONASE ) 50 mcg/actuation nasal spray     . fluticasone  propionate (FLONASE ) 50 mcg/actuation nasal spray Administer 1 spray into affected nostril(s).    . guanFACINE  (INTUNIV ) 4 mg 24 Hour ER tablet     . haloperidoL  (HALDOL ) 10 mg tablet     . haloperidoL  (HALDOL ) 10 mg tablet Take 10 mg by mouth.    SABRA HYDROcodone-acetaminophen  (NORCO) 5-325 mg tablet     . ipratropium (ATROVENT ) 42 mcg (0.06 %) nasal spray     . ipratropium (ATROVENT ) 42 mcg (0.06 %) nasal spray Administer 2 sprays into affected nostril(s).    SABRA ipratropium-albuteroL (DUO-NEB) 0.5-2.5 mg/3 mL nebulizer solution     . levothyroxine  (SYNTHROID , UNITHROID ) 25 mcg tablet     . levothyroxine  (SYNTHROID , UNITHROID ) 25 mcg tablet Take 25 mcg by mouth.    . LORazepam  (ATIVAN ) 2 mg tablet     . LORazepam  (ATIVAN ) 2 mg tablet Take 4 mg by mouth.    . metoprolol  succinate (TOPROL -XL) 25 mg 24 hr tablet     . metoprolol  succinate (TOPROL -XL) 25 mg 24 hr tablet Take 12.5 mg by mouth.    . OLANZapine  (ZyPREXA ) 5 mg tablet Take 5 mg by mouth.    . OLANZapine  (ZyPREXA ) 5 mg tablet Take 5 mg by mouth.    . polyethylene glycol (GLYCOLAX ) 17 gram packet Take 17 g by mouth.    . senna (SENOKOT) 8.6 mg tablet Take 2 tablets by mouth in the morning and 2 tablets in the evening.    . senna 8.6 mg tablet     . sodium chloride  0.65 % drops  Administer 1 spray into affected nostril(s).    . Vitamin D3 25 mcg (1,000 unit) tablet      No current facility-administered medications on file prior to visit.

## 2023-07-27 NOTE — Progress Notes (Signed)
 [facility] 07/27/23  Dental procedures in this visit  . I8889 - PROPHYLAXIS - ADULT  . I9879 - PERIODIC ORAL EVALUATION - ESTABLISHED PATIENT  . I9725 - BITEWINGS - FOUR RADIOGRAPHIC IMAGES  . I9779 - SINGLE X-RAY  . I9769 - ADDITIONAL X-RAY   HEALTH HISTORY ? Vitals:  BP Readings from Last 1 Encounters:  07/27/23 102/71  Pulse:  72  Medical history was reviewed and updated. No contraindication to care.  Medical History[1] Surgical History[2] Social History[3] Family History[4] Medications Ordered Prior to Encounter[5]  Medical Risk Assessment ASA GRADE: ASA 3 - Patient with moderate systemic disease with functional limitations  Subjective: Patient reports no change to condition since last visit.   Objective:  Radiographs taken: 2-4 BWs and selected PAs of area.  Dental Examination Dr. Billy completed an exam and noted the following conditions and/or findings:  Intraoral exam completed and findings: wnl. Extraoral exam completed and findings: wnl.  Periodontal Evaluation Periodontal Charting: Updated full mouth periodontal charting and documented all findings in patient's tooth chart: Yes Radiographic studies show bone levels WNL  Periodontal findings:  Tissue color: Pigmented, consistency: Localized rolled and erthymatous margins CAL: n/a Bleeding: Less than 30% of sites, generalized Furcation involvements: n/a Mobility: None Calculus: Light supragingival plaque and calculus Patient oral hygiene: Fair Patient's oral hygiene is n/a (first visit) since last visit  Assessment: Periodontal Diagnosis: Healthy, within normal limits. Dental Diagnosis: Healthy hard/soft tissues. Risks and potential complications: due to deep grooves in posterior teeth, filling material is needed to prevent decay. Informed patient of all periodontal findings. Informed patient of recommended treatment procedure(s) based on findings. Patient confirmed they  understood and has no additional questions.  Plan: Cavitron, handscale, floss, polish with Nupro coarse paste placed at end of appointment  OHI: Reviewed brushing and flossing technique.   Recommended recall interval: 6 months.  Next Visit: Hygiene appointment: poe and prophy  Treatment Providers Tiffany Biddy, RDH Hampton Billy, DDS Almetta Finder, DAI Lacinda Rule, MA [facility] [place] [facility] [address], [place] [address] [place] [address] [phone]        [1] Past Medical History: Diagnosis Date  . Bipolar disorder   [2] History reviewed. No pertinent surgical history. [3] Social History Tobacco Use  . Smoking status: Never  . Smokeless tobacco: Never  Substance Use Topics  . Alcohol use: Never  . Drug use: Never  [4] Family History Problem Relation Name Age of Onset  . No Known Problems Mother    . No Known Problems Father    [5] Current Outpatient Medications on File Prior to Visit  Medication Sig Dispense Refill  . FeroSuL 325 mg (65 mg iron) tablet 65 mg.    . glycopyrrolate (ROBINUL) 1 mg tablet 1 mg.    . mirtazapine (REMERON) 7.5 mg tablet 7.5 mg.    . acetaminophen  (TYLENOL  8 HOUR) 650 mg 8 hr tablet     . atorvastatin  (LIPITOR) 40 mg tablet     . Banophen  50 mg capsule     . cholecalciferol  (VITAMIN D-3) 25 mcg (1,000 unit) tablet Take 1,000 Units by mouth.    . ClearLax 17 gram/dose powder     . clonazePAM  (KlonoPIN ) 0.5 mg tablet     . clonazePAM  (KlonoPIN ) 0.5 mg tablet Take 1 mg by mouth.    . clonazePAM  (KlonoPIN ) 1 mg tablet     . clonazePAM  (KlonoPIN ) 1 mg tablet Take 1 mg by mouth.    . cloZAPine  (CLOZARIL )  100 mg tablet     . cloZAPine  (CLOZARIL ) 25 mg tablet     . cloZAPine  (CLOZARIL ) 25 mg tablet Take 75 mg by mouth.    . cloZAPine  (CLOZARIL ) 50 mg tablet Take 150 mg by mouth.    . Compound W 17 % gel     . Deep Sea Nasal 0.65 % nasal spray     . diphenhydrAMINE  (BENADRYL ) 25 mg capsule     . diphenhydrAMINE  (BENADRYL )  50 mg capsule Take 50 mg by mouth.    . diphenhydrAMINE  (SOMINEX) 25 mg tablet Take 25-50 mg by mouth.    . divalproex  (DEPAKOTE ) 250 mg 24 hr tablet     . divalproex  (DEPAKOTE ) 250 mg 24 hr tablet Take 250 mg by mouth in the morning and 250 mg in the evening.    . divalproex  (DEPAKOTE ) 250 mg EC tablet     . divalproex  (DEPAKOTE ) 500 mg 24 hr tablet     . divalproex  (DEPAKOTE ) 500 mg 24 hr tablet Take 500 mg by mouth in the morning and 500 mg in the evening.    . fluticasone  propionate (FLONASE ) 50 mcg/actuation nasal spray     . fluticasone  propionate (FLONASE ) 50 mcg/actuation nasal spray Administer 1 spray into affected nostril(s).    . guanFACINE  (INTUNIV ) 4 mg 24 Hour ER tablet     . haloperidoL  (HALDOL ) 10 mg tablet     . haloperidoL  (HALDOL ) 10 mg tablet Take 10 mg by mouth.    SABRA HYDROcodone-acetaminophen  (NORCO) 5-325 mg tablet     . ipratropium (ATROVENT ) 42 mcg (0.06 %) nasal spray     . ipratropium (ATROVENT ) 42 mcg (0.06 %) nasal spray Administer 2 sprays into affected nostril(s).    SABRA ipratropium-albuteroL (DUO-NEB) 0.5-2.5 mg/3 mL nebulizer solution     . levothyroxine  (SYNTHROID , UNITHROID ) 25 mcg tablet     . levothyroxine  (SYNTHROID , UNITHROID ) 25 mcg tablet Take 25 mcg by mouth.    . LORazepam  (ATIVAN ) 2 mg tablet     . LORazepam  (ATIVAN ) 2 mg tablet Take 4 mg by mouth.    . metoprolol  succinate (TOPROL -XL) 25 mg 24 hr tablet     . metoprolol  succinate (TOPROL -XL) 25 mg 24 hr tablet Take 12.5 mg by mouth.    . OLANZapine  (ZyPREXA ) 5 mg tablet Take 5 mg by mouth.    . OLANZapine  (ZyPREXA ) 5 mg tablet Take 5 mg by mouth.    . polyethylene glycol (GLYCOLAX ) 17 gram packet Take 17 g by mouth.    . senna (SENOKOT) 8.6 mg tablet Take 2 tablets by mouth in the morning and 2 tablets in the evening.    . senna 8.6 mg tablet     . sodium chloride  0.65 % drops Administer 1 spray into affected nostril(s).    . Vitamin D3 25 mcg (1,000 unit) tablet      No current  facility-administered medications on file prior to visit.

## 2023-09-07 NOTE — Progress Notes (Signed)
 [facility] 09/07/23 No primary care provider on file. Treatment Providers Hampton Slade, DDS  Dental procedures in this visit  . D2140 - AMALGAM 1 SURFACE 31 O    HEALTH HISTORY ? Vitals:  BP Readings from Last 1 Encounters:  09/07/23 119/60    Pulse:  83 Medical history was reviewed and updated. No contraindication to care. Medical History[1] Surgical History[2] Social History   Tobacco Use  . Smoking status: Never  . Smokeless tobacco: Never  Substance Use Topics  . Alcohol use: Never   Family History[3] Medications Ordered Prior to Encounter[4] Medical Risk Assessment ASA GRADE: ASA 2 - Patient with mild systemic disease with no functional limitations  Subjective: [name] is a 23 y.o. male presenting for treatment on tooth number: tooth #31-O amalgam restoration                               Patient reports no change to condition since last visit  History of presenting condition: Tooth/teeth to be treated testing asymptomatic  Objective:  Radiographs taken: No radiographs captured at this appointment.  All Radiographs Entry:  07/27/23   Assessment: Procedure:  Amalgam Direct restorations of tooth/teeth:  #31 O due to primary caries Procedure risks, benefits, and concerns were explained to the patient.  A formal consent was signed after the risks were explained to the patient.  Consent obtained? Yes  Paused to confirm correct patient, correct teeth for treatment, and confirmed diagnosis for treatment.  Topical anesthetic (benzocaine  20%) was applied  ANESTHETIC 1:Lidocaine 2% epi: 1:100K x Carpules: 2 carpules;   Procedure performed under N/A isolation  No matrix used Medicament placed prior to restoring: Gluma.   Tooth restored to function using amalgam. Amalgam:  NA Contacts and occlusion checked and adjusted. Restoration polished using Burnishing  Plan: Patient informed about possible short-term sensitivity to  cold.   Provider should be contacted in the event of severe sensitivity or any sensitivity to heat and/or pain on biting. Patient will return on: 10/27/23 for Amalgam restoration on tooth #19-O  Treatment Providers Dental Assistant: Romualdo Wiginton Hampton Slade, DDS [facility] [address] [place] [address] [phone]        [1] Past Medical History: Diagnosis Date  . Bipolar disorder   [2] History reviewed. No pertinent surgical history. [3] Family History Problem Relation Name Age of Onset  . No Known Problems Mother    . No Known Problems Father    [4] Current Outpatient Medications on File Prior to Visit  Medication Sig Dispense Refill  . acetaminophen  (TYLENOL  8 HOUR) 650 mg 8 hr tablet     . atorvastatin  (LIPITOR) 40 mg tablet     . Banophen  50 mg capsule     . cholecalciferol  (VITAMIN D-3) 25 mcg (1,000 unit) tablet Take 1,000 Units by mouth.    . ClearLax 17 gram/dose powder     . clonazePAM  (KlonoPIN ) 0.5 mg tablet     . clonazePAM  (KlonoPIN ) 0.5 mg tablet Take 1 mg by mouth.    . clonazePAM  (KlonoPIN ) 1 mg tablet     . clonazePAM  (KlonoPIN ) 1 mg tablet Take 1 mg by mouth.    . cloZAPine  (CLOZARIL ) 100 mg tablet     . cloZAPine  (CLOZARIL ) 25 mg tablet     . cloZAPine  (CLOZARIL ) 25 mg tablet Take 75 mg by mouth.    SABRA  cloZAPine  (CLOZARIL ) 50 mg tablet Take 150 mg by mouth.    . Compound W 17 % gel     . Deep Sea Nasal 0.65 % nasal spray     . diphenhydrAMINE  (BENADRYL ) 25 mg capsule     . diphenhydrAMINE  (BENADRYL ) 50 mg capsule Take 50 mg by mouth.    . diphenhydrAMINE  (SOMINEX) 25 mg tablet Take 25-50 mg by mouth.    . divalproex  (DEPAKOTE ) 250 mg 24 hr tablet     . divalproex  (DEPAKOTE ) 250 mg 24 hr tablet Take 250 mg by mouth in the morning and 250 mg in the evening.    . divalproex  (DEPAKOTE ) 250 mg EC tablet     . divalproex  (DEPAKOTE ) 500 mg 24 hr tablet     . divalproex  (DEPAKOTE )  500 mg 24 hr tablet Take 500 mg by mouth in the morning and 500 mg in the evening.    . FeroSuL 325 mg (65 mg iron) tablet 65 mg.    . fluticasone  propionate (FLONASE ) 50 mcg/actuation nasal spray     . fluticasone  propionate (FLONASE ) 50 mcg/actuation nasal spray Administer 1 spray into affected nostril(s).    SABRA glycopyrrolate (ROBINUL) 1 mg tablet 1 mg.    . guanFACINE  (INTUNIV ) 4 mg 24 Hour ER tablet     . haloperidoL  (HALDOL ) 10 mg tablet     . haloperidoL  (HALDOL ) 10 mg tablet Take 10 mg by mouth.    SABRA HYDROcodone-acetaminophen  (NORCO) 5-325 mg tablet     . ipratropium (ATROVENT ) 42 mcg (0.06 %) nasal spray     . ipratropium (ATROVENT ) 42 mcg (0.06 %) nasal spray Administer 2 sprays into affected nostril(s).    SABRA ipratropium-albuteroL (DUO-NEB) 0.5-2.5 mg/3 mL nebulizer solution     . levothyroxine  (SYNTHROID , UNITHROID ) 25 mcg tablet     . levothyroxine  (SYNTHROID , UNITHROID ) 25 mcg tablet Take 25 mcg by mouth.    . LORazepam  (ATIVAN ) 2 mg tablet     . LORazepam  (ATIVAN ) 2 mg tablet Take 4 mg by mouth.    . metoprolol  succinate (TOPROL -XL) 25 mg 24 hr tablet     . metoprolol  succinate (TOPROL -XL) 25 mg 24 hr tablet Take 12.5 mg by mouth.    . mirtazapine (REMERON) 7.5 mg tablet 7.5 mg.    . OLANZapine  (ZyPREXA ) 5 mg tablet Take 5 mg by mouth.    . OLANZapine  (ZyPREXA ) 5 mg tablet Take 5 mg by mouth.    . polyethylene glycol (GLYCOLAX ) 17 gram packet Take 17 g by mouth.    . senna (SENOKOT) 8.6 mg tablet Take 2 tablets by mouth in the morning and 2 tablets in the evening.    . senna 8.6 mg tablet     . sodium chloride  0.65 % drops Administer 1 spray into affected nostril(s).    . Vitamin D3 25 mcg (1,000 unit) tablet      No current facility-administered medications on file prior to visit.

## 2023-09-17 ENCOUNTER — Emergency Department (HOSPITAL_COMMUNITY)
Admission: EM | Admit: 2023-09-17 | Discharge: 2023-09-21 | Disposition: A | Discharge status: Home / Self Care | Payer: MEDICAID | Attending: Emergency Medicine | Admitting: Emergency Medicine

## 2023-09-17 ENCOUNTER — Other Ambulatory Visit: Payer: Self-pay

## 2023-09-17 DIAGNOSIS — F322 Major depressive disorder, single episode, severe without psychotic features: Secondary | ICD-10-CM | POA: Diagnosis not present

## 2023-09-17 DIAGNOSIS — R4689 Other symptoms and signs involving appearance and behavior: Secondary | ICD-10-CM | POA: Diagnosis present

## 2023-09-17 DIAGNOSIS — R45851 Suicidal ideations: Secondary | ICD-10-CM | POA: Insufficient documentation

## 2023-09-17 LAB — CBC WITH DIFFERENTIAL/PLATELET
Abs Immature Granulocytes: 0.01 10*3/uL (ref 0.00–0.07)
Basophils Absolute: 0 10*3/uL (ref 0.0–0.1)
Basophils Relative: 0 %
Eosinophils Absolute: 0.1 10*3/uL (ref 0.0–0.5)
Eosinophils Relative: 2 %
HCT: 36.8 % — ABNORMAL LOW (ref 39.0–52.0)
Hemoglobin: 13.4 g/dL (ref 13.0–17.0)
Immature Granulocytes: 0 %
Lymphocytes Relative: 34 %
Lymphs Abs: 2.5 10*3/uL (ref 0.7–4.0)
MCH: 33 pg (ref 26.0–34.0)
MCHC: 36.4 g/dL — ABNORMAL HIGH (ref 30.0–36.0)
MCV: 90.6 fL (ref 80.0–100.0)
Monocytes Absolute: 0.8 10*3/uL (ref 0.1–1.0)
Monocytes Relative: 11 %
Neutro Abs: 3.9 10*3/uL (ref 1.7–7.7)
Neutrophils Relative %: 53 %
Platelets: 159 10*3/uL (ref 150–400)
RBC: 4.06 MIL/uL — ABNORMAL LOW (ref 4.22–5.81)
RDW: 12.2 % (ref 11.5–15.5)
WBC: 7.4 10*3/uL (ref 4.0–10.5)
nRBC: 0 % (ref 0.0–0.2)

## 2023-09-17 LAB — VALPROIC ACID LEVEL: Valproic Acid Lvl: 64 ug/mL (ref 50–100)

## 2023-09-17 LAB — COMPREHENSIVE METABOLIC PANEL WITH GFR
ALT: 13 U/L (ref 0–44)
AST: 18 U/L (ref 15–41)
Albumin: 4.3 g/dL (ref 3.5–5.0)
Alkaline Phosphatase: 56 U/L (ref 38–126)
Anion gap: 11 (ref 5–15)
BUN: 11 mg/dL (ref 6–20)
CO2: 23 mmol/L (ref 22–32)
Calcium: 9.6 mg/dL (ref 8.9–10.3)
Chloride: 101 mmol/L (ref 98–111)
Creatinine, Ser: 0.65 mg/dL (ref 0.61–1.24)
GFR, Estimated: >60 mL/min (ref >60)
Glucose, Bld: 106 mg/dL — ABNORMAL HIGH (ref 70–99)
Potassium: 3.6 mmol/L (ref 3.5–5.1)
Sodium: 135 mmol/L (ref 135–145)
Total Bilirubin: 0.9 mg/dL (ref 0.0–1.2)
Total Protein: 7.6 g/dL (ref 6.5–8.1)

## 2023-09-17 LAB — I-STAT CHEM 8, ED
BUN: 10 mg/dL (ref 6–20)
Calcium, Ion: 1.23 mmol/L (ref 1.15–1.40)
Chloride: 105 mmol/L (ref 98–111)
Creatinine, Ser: 0.8 mg/dL (ref 0.61–1.24)
Glucose, Bld: 102 mg/dL — ABNORMAL HIGH (ref 70–99)
HCT: 38 % — ABNORMAL LOW (ref 39.0–52.0)
Hemoglobin: 12.9 g/dL — ABNORMAL LOW (ref 13.0–17.0)
Potassium: 4 mmol/L (ref 3.5–5.1)
Sodium: 142 mmol/L (ref 135–145)
TCO2: 25 mmol/L (ref 22–32)

## 2023-09-17 LAB — ETHANOL: Alcohol, Ethyl (B): <15 mg/dL (ref <15)

## 2023-09-17 LAB — SALICYLATE LEVEL: Salicylate Lvl: <7 mg/dL — ABNORMAL LOW (ref 7.0–30.0)

## 2023-09-17 LAB — ACETAMINOPHEN LEVEL: Acetaminophen (Tylenol), Serum: <10 ug/mL — ABNORMAL LOW (ref 10–30)

## 2023-09-17 LAB — CK: Total CK: 91 U/L (ref 49–397)

## 2023-09-17 MED ORDER — SENNA 8.6 MG PO TABS
2.0000 | ORAL_TABLET | Freq: Every day | ORAL | Status: DC
  Administered 2023-09-17 – 2023-09-20 (×4): 17.2 mg via ORAL
  Filled 2023-09-17 (×4): qty 2

## 2023-09-17 MED ORDER — CLONAZEPAM 1 MG PO TABS
1.0000 mg | ORAL_TABLET | Freq: Two times a day (BID) | ORAL | Status: DC
  Administered 2023-09-17 – 2023-09-18 (×2): 1 mg via ORAL
  Filled 2023-09-17 (×2): qty 1

## 2023-09-17 MED ORDER — DIPHENHYDRAMINE HCL 50 MG/ML IJ SOLN
25.0000 mg | Freq: Once | INTRAMUSCULAR | Status: AC
  Administered 2023-09-17: 25 mg via INTRAMUSCULAR
  Filled 2023-09-17: qty 1

## 2023-09-17 MED ORDER — METOPROLOL SUCCINATE ER 25 MG PO TB24
12.5000 mg | ORAL_TABLET | Freq: Every day | ORAL | Status: DC
  Administered 2023-09-17 – 2023-09-20 (×4): 12.5 mg via ORAL
  Filled 2023-09-17: qty 1
  Filled 2023-09-17 (×4): qty 0.5

## 2023-09-17 MED ORDER — LORAZEPAM 1 MG PO TABS
1.0000 mg | ORAL_TABLET | ORAL | Status: DC | PRN
  Administered 2023-09-18 – 2023-09-20 (×2): 1 mg via ORAL
  Filled 2023-09-17 (×2): qty 1

## 2023-09-17 MED ORDER — CLOZAPINE 100 MG PO TABS
100.0000 mg | ORAL_TABLET | Freq: Two times a day (BID) | ORAL | Status: DC
  Administered 2023-09-17 – 2023-09-20 (×7): 100 mg via ORAL
  Filled 2023-09-17 (×7): qty 1

## 2023-09-17 MED ORDER — ATORVASTATIN CALCIUM 40 MG PO TABS
40.0000 mg | ORAL_TABLET | Freq: Every day | ORAL | Status: DC
  Administered 2023-09-17 – 2023-09-20 (×4): 40 mg via ORAL
  Filled 2023-09-17 (×4): qty 1

## 2023-09-17 MED ORDER — LORAZEPAM 2 MG/ML IJ SOLN
1.0000 mg | Freq: Once | INTRAMUSCULAR | Status: AC
  Administered 2023-09-17: 1 mg via INTRAMUSCULAR
  Filled 2023-09-17: qty 1

## 2023-09-17 MED ORDER — GUANFACINE HCL ER 1 MG PO TB24
4.0000 mg | ORAL_TABLET | Freq: Every day | ORAL | Status: DC
  Administered 2023-09-17 – 2023-09-20 (×4): 4 mg via ORAL
  Filled 2023-09-17 (×5): qty 4

## 2023-09-17 MED ORDER — HALOPERIDOL 5 MG PO TABS
5.0000 mg | ORAL_TABLET | Freq: Two times a day (BID) | ORAL | Status: DC
  Administered 2023-09-17 – 2023-09-18 (×2): 5 mg via ORAL
  Filled 2023-09-17 (×2): qty 1

## 2023-09-17 MED ORDER — IPRATROPIUM BROMIDE 0.06 % NA SOLN
2.0000 | Freq: Three times a day (TID) | NASAL | Status: DC
  Administered 2023-09-17 – 2023-09-20 (×9): 2 via NASAL
  Filled 2023-09-17: qty 15

## 2023-09-17 MED ORDER — DIVALPROEX SODIUM ER 250 MG PO TB24
250.0000 mg | ORAL_TABLET | Freq: Two times a day (BID) | ORAL | Status: DC
  Administered 2023-09-17 – 2023-09-18 (×2): 250 mg via ORAL
  Filled 2023-09-17 (×2): qty 1

## 2023-09-17 MED ORDER — LEVOTHYROXINE SODIUM 25 MCG PO TABS
25.0000 ug | ORAL_TABLET | Freq: Every day | ORAL | Status: DC
  Administered 2023-09-17 – 2023-09-20 (×4): 25 ug via ORAL
  Filled 2023-09-17 (×4): qty 1

## 2023-09-17 NOTE — ED Notes (Signed)
 Report given to [facility], belongings placed in locker 34

## 2023-09-17 NOTE — ED Notes (Signed)
 Patient is resting comfortably.
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
## 2023-09-17 NOTE — ED Notes (Signed)
 Phleb completed for bloodwork in [facility]

## 2023-09-17 NOTE — ED Provider Notes (Signed)
 [place] EMERGENCY DEPARTMENT AT [facility] Provider Note   CSN: [id number] Arrival date & time: 09/17/23  1749     Patient presents with: No chief complaint on file.   Stephen Robinson is a 23 y.o. male.   23 year old male with history of ADHD, OCD presents under IVC by law enforcement.  Patient reportedly states he was trying to commit suicide by jumping out of a window or a roof.  Police were called by third-party.  The please officers at the bedside of very familiar with this patient.  Patient states that he is not hearing any voices.  Does not need to using alcohol and marijuana today.  Denies any intentional ingestions.  Is currently in handcuffs denies any pain or trauma from the fall.       Prior to Admission medications   Medication Sig Start Date End Date Taking? Authorizing Provider  atorvastatin (LIPITOR) 40 MG tablet Take 40 mg by mouth daily.    [provider]  cholecalciferol  (VITAMIN D3) 25 MCG (1000 UNIT) tablet Take 1,000 Units by mouth daily.    [provider]  clonazePAM  (KLONOPIN ) 0.5 MG tablet Take 1 mg by mouth 2 (two) times daily.    [provider]  cloZAPine  (CLOZARIL ) 100 MG tablet Take 100 mg by mouth 2 (two) times daily.    [provider]  diphenhydrAMINE  (BENADRYL ) 50 MG capsule Take 50 mg by mouth at bedtime.    [provider]  divalproex  (DEPAKOTE  ER) 250 MG 24 hr tablet Take 250 mg by mouth in the morning and at bedtime. Takes with the 500 mg tablet = 750 mg    [provider]  divalproex  (DEPAKOTE  ER) 500 MG 24 hr tablet Take 500 mg by mouth in the morning and at bedtime. Total dose 750 mg    [provider]  guanFACINE  (INTUNIV ) 4 MG TB24 ER tablet Take 1 tablet (4 mg total) by mouth at bedtime. Patient taking differently: Take 4 mg by mouth daily. 02/09/21 06/21/23  Twilla Galea, MD  haloperidol  (HALDOL ) 5 MG tablet Take 5 mg by mouth 2 (two) times daily.     [provider]  ipratropium (ATROVENT) 0.06 % nasal spray Place 2 sprays into both nostrils 3 (three) times daily.    [provider]  levothyroxine  (SYNTHROID ) 25 MCG tablet Take 1 tablet (25 mcg total) by mouth daily. 05/06/22   Rankin, Shuvon B, NP  metoprolol  succinate (TOPROL -XL) 25 MG 24 hr tablet Take 12.5 mg by mouth daily.    [provider]  senna (SENOKOT) 8.6 MG TABS tablet Take 2 tablets by mouth in the morning and at bedtime.    [provider]    Allergies: Seroquel [quetiapine]    Review of Systems  All other systems reviewed and are negative.   Updated Vital Signs BP 112/76 (BP Location: Left Arm)   Pulse (!) 110   Temp 98.4 F (36.9 C) (Oral)   Resp 16   SpO2 95%   Physical Exam Vitals and nursing note reviewed.  Constitutional:      General: He is not in acute distress.    Appearance: Normal appearance. He is well-developed. He is not toxic-appearing.  HENT:     Head: Normocephalic and atraumatic.   Eyes:     General: Lids are normal.     Conjunctiva/sclera: Conjunctivae normal.     Pupils: Pupils are equal, round, and reactive to light.   Neck:  Thyroid : No thyroid  mass.     Trachea: No tracheal deviation.   Cardiovascular:     Rate and Rhythm: Normal rate and regular rhythm.     Heart sounds: Normal heart sounds. No murmur heard.    No gallop.  Pulmonary:     Effort: Pulmonary effort is normal. No respiratory distress.     Breath sounds: Normal breath sounds. No stridor. No decreased breath sounds, wheezing, rhonchi or rales.  Abdominal:     General: There is no distension.     Palpations: Abdomen is soft.     Tenderness: There is no abdominal tenderness. There is no rebound.   Musculoskeletal:        General: No tenderness. Normal range of motion.     Cervical back: Normal range of motion and neck supple.     Comments: Patient nontender along the cervical thoracic lumbar spine.  Has normal range of  motion's with his hips.  Is able to ambulate with good steady gait no pain to palpation at his rib cage or abdomen   Skin:    General: Skin is warm and dry.     Findings: No abrasion or rash.   Neurological:     General: No focal deficit present.     Mental Status: He is alert and oriented to person, place, and time. Mental status is at baseline.     GCS: GCS eye subscore is 4. GCS verbal subscore is 5. GCS motor subscore is 6.     Cranial Nerves: No cranial nerve deficit.     Sensory: No sensory deficit.     Motor: Motor function is intact.     Gait: Gait is intact.   Psychiatric:        Attention and Perception: Attention normal.        Mood and Affect: Affect is flat.        Speech: Speech is delayed.        Behavior: Behavior is withdrawn.        Thought Content: Thought content includes suicidal ideation. Thought content includes suicidal plan.     (all labs ordered are listed, but only abnormal results are displayed) Labs Reviewed  ETHANOL  ACETAMINOPHEN  LEVEL  SALICYLATE LEVEL  CK  CBC WITH DIFFERENTIAL/PLATELET  COMPREHENSIVE METABOLIC PANEL WITH GFR  I-STAT CHEM 8, ED    EKG: None  Radiology: No results found.   Procedures   Medications Ordered in the ED - No data to display                                  Medical Decision Making Amount and/or Complexity of Data Reviewed Labs: ordered.  Risk OTC drugs. Prescription drug management.   Patient, cooperative here.  IVC paperwork completed.  Labs are reassuring here.  Home meds restarted.  Patient is medically cleared and will consult psychiatry     Final diagnoses:  None    ED Discharge Orders     None          Lind Repine, MD 09/17/23 2008

## 2023-09-17 NOTE — ED Notes (Signed)
 Pt took all medication without issue. TTS now in progress.

## 2023-09-17 NOTE — ED Triage Notes (Signed)
 Pt comes in today for IVC from group home for attempting to jump off of the roof and kill himself. Pt denies homicidal thoughts, states he has been getting into a lot of trouble lately.

## 2023-09-18 MED ORDER — ZIPRASIDONE MESYLATE 20 MG IM SOLR
20.0000 mg | Freq: Once | INTRAMUSCULAR | Status: AC
  Administered 2023-09-18: 20 mg via INTRAMUSCULAR
  Filled 2023-09-18: qty 20

## 2023-09-18 MED ORDER — CLONAZEPAM 1 MG PO TABS
1.0000 mg | ORAL_TABLET | Freq: Two times a day (BID) | ORAL | Status: DC
  Administered 2023-09-18 – 2023-09-20 (×5): 1 mg via ORAL
  Filled 2023-09-18 (×5): qty 1

## 2023-09-18 MED ORDER — DIVALPROEX SODIUM ER 500 MG PO TB24
750.0000 mg | ORAL_TABLET | Freq: Two times a day (BID) | ORAL | Status: DC
  Administered 2023-09-18 – 2023-09-20 (×5): 750 mg via ORAL
  Filled 2023-09-18 (×5): qty 1

## 2023-09-18 MED ORDER — HALOPERIDOL 5 MG PO TABS
5.0000 mg | ORAL_TABLET | Freq: Two times a day (BID) | ORAL | Status: DC
  Administered 2023-09-18 – 2023-09-19 (×2): 5 mg via ORAL
  Filled 2023-09-18 (×2): qty 1

## 2023-09-18 MED ORDER — STERILE WATER FOR INJECTION IJ SOLN
INTRAMUSCULAR | Status: AC
  Administered 2023-09-18: 1 mL via INTRAMUSCULAR
  Filled 2023-09-18: qty 10

## 2023-09-18 NOTE — ED Notes (Addendum)
 Pt's linens changed on his bed after soiling them in his sleep. Pt's soiled underwear placed in belongings bag in locker 34

## 2023-09-18 NOTE — ED Notes (Signed)
 Pt was standing at window harassing staff stating  I am going to annoy you all night. Pt then proceeds to attempt entry into the Psych NP office but pressing key pad attempting to unlock it. Pt then climbs onto locked linen cabinet and jumps off. Pt continued with behaviors including climbing on a chair to attempt to climb the wall to the other side of the unit. Pt asked multiple times to get down without success. This Clinical research associate makes EDP aware and otains orders for IM Benadryl  and Ativan .

## 2023-09-18 NOTE — ED Notes (Signed)
 Pt seen attempting to go into another patient's room and directed back to his room. Pt then came back to the nurse station and began banging on all the windows with fists and head. Pt reached between the windows and started grabbing computer cords despite multiple attempts at redirection. Provider informed, meds ordered.

## 2023-09-18 NOTE — ED Notes (Signed)
 Pt was given snack.  Pt made 1 phone call

## 2023-09-18 NOTE — ED Provider Notes (Signed)
 Emergency Medicine Observation Re-evaluation Note  Stephen Robinson is a 23 y.o. male, seen on rounds today.  Pt initially presented to the ED for complaints of IVC Currently, the patient is resting.  Physical Exam  BP 128/86 (BP Location: Right Arm)   Pulse (!) 102   Temp 97.6 F (36.4 C) (Oral)   Resp 16   SpO2 97%  Physical Exam General: NAD   ED Course / MDM  EKG:   I have reviewed the labs performed to date as well as medications administered while in observation.  Recent changes in the last 24 hours include mild agitation.  Plan  Current plan is for inpatient placement.    Stephen Carte, MD 09/18/23 [phone]

## 2023-09-18 NOTE — ED Notes (Signed)
 Pt came up to window this morning stating he had wet the bed.  Pt given items to shower and clean clothes.  Pts bed was cleaned and new bedding.  Pt was given breakfast when it arrived.  Pt has had coffee and coke today.  Pt had a biscuit. Pt has attempted 2 phone calls w/o answers.

## 2023-09-18 NOTE — Consult Note (Signed)
 Efforts to reach Care giver today failed.  Care giver finally called back and questions were asked about Medications.  She will call back in Am with accurate Medications list and dose.  She is asked to call with last Clozaril  level in am.  Provider will make Medication changes based on accurate Medication list from care giver in am.

## 2023-09-18 NOTE — Progress Notes (Signed)
 Patient has been denied by [facility] due to no appropriate beds available. Patient meets [facility] inpatient criteria per Mckinley Spells, NP. Patient has been faxed out to the following facilities:   [facility]  [address] [place] [place] [address] [phone]  [facility]  [address], [place] [address] [phone]  [facility]  [address] [place] [address] [phone]  [facility]  [address] [place], [place] [address] [phone]  [facility] Patient Placement  [facility], [place] [phone]  [facility] Center-Adult  [address] Johnella Naas [place] [address] [phone]  [facility]  [address] [place] [address] [phone]  [facility]  [address], [place] [phone]  [facility]  [address] [place] [address] [phone]  [facility]  [address] [place] [address] [phone]  [facility]  [address] [place] [address] [phone]  [facility] [address]  [place] [address] [phone]  [facility]  [facility] Josephina Nicks [place] [address] [phone]  [facility]  [address] [place], [place] [address] [phone]  [facility]  [address] [place] [address] [phone]  [facility]  [address], [place] [address] [phone]  [facility]  [address] [place] [address] [phone]   [facility]  [address] [place] [address] [phone]  [facility]  [address] [place] [address] [phone]   Phares Brasher, MSW, LCSW-A  9:57 AM 09/18/2023

## 2023-09-19 DIAGNOSIS — R45851 Suicidal ideations: Secondary | ICD-10-CM

## 2023-09-19 DIAGNOSIS — F322 Major depressive disorder, single episode, severe without psychotic features: Secondary | ICD-10-CM

## 2023-09-19 MED ORDER — OLANZAPINE 5 MG PO TBDP
5.0000 mg | ORAL_TABLET | Freq: Two times a day (BID) | ORAL | Status: DC
  Administered 2023-09-19 – 2023-09-20 (×3): 5 mg via ORAL
  Filled 2023-09-19 (×3): qty 1

## 2023-09-19 MED ORDER — OLANZAPINE 5 MG PO TBDP
5.0000 mg | ORAL_TABLET | Freq: Every day | ORAL | Status: DC
  Administered 2023-09-19: 5 mg via ORAL
  Filled 2023-09-19: qty 1

## 2023-09-19 NOTE — ED Provider Notes (Signed)
 Emergency Medicine Observation Re-evaluation Note  Stephen Robinson is a 23 y.o. male, seen on rounds today.  Pt initially presented to the ED for complaints of IVC Currently, the patient is sleeping.  Physical Exam  BP 114/74 (BP Location: Left Arm)   Pulse 68   Temp 97.6 F (36.4 C) (Oral)   Resp 16   SpO2 100%  Physical Exam General: nad Cardiac: regular rate   ED Course / MDM  EKG:   I have reviewed the labs performed to date as well as medications administered while in observation.  Recent changes in the last 24 hours include no acute events.  Plan  Current plan is for inpt psych.    Trish Furl, MD 09/19/23 [phone]

## 2023-09-19 NOTE — ED Notes (Signed)
 Patient has been sleeping.  Patient woke up to eat.  Cooperative at this time.

## 2023-09-19 NOTE — ED Notes (Signed)
 Patient is yelling at staff because not getting his way and refusing to acknowledge or follow policy and procedures of the unit. Patient is not suicidal or homicidal. Patient not delusional or paranoid.

## 2023-09-19 NOTE — Progress Notes (Signed)
 Inpatient Psychiatric Referral   Patient was recommended inpatient per Arvell Latin, NP . There are no available beds at [facility], per [facility] . Patient was re-faxed to the following out of network facilities:   Destination  Service Provider Request Status Address Phone Fax  [facility]  Pending - Request Sent [address] [place] [place] [address] [phone]  [facility]  Pending - Request [facility] [address] [place] [address] [phone]  [facility]  Pending - Request Sent [address] [place] [address] [phone]  [facility]  Pending - Request Sent [address] [place], [place] [address] [phone]  [facility]-Behavioral Health Patient Placement  Pending - Request [facility], [place] [phone]  [facility]  Pending - Request Sent [address] Johnella Naas [place] [address] [phone]  [facility]  Pending - Request Sent [address] Sharren Decree [place] [address] [phone]  [facility]  Pending - Request Sent [address] Sharren Decree [place] [phone]  [facility]  Pending - Request Sent [address], [employer] [place] [address] [phone]  [facility]  Pending - Request Sent [address] Shelva Dice [place] [address] [phone]  [facility]  Pending - Request Sent [address] [place] [address] [phone]  [facility] [place]  Pending - Request [place] [address] [phone]  [facility]  Pending - Request [facility] Josephina Nicks [place] [address] [phone]  [facility]  Pending - Request Sent [address] [place], [place] [address] [phone]  [facility]  Pending - Request Sent [address] [place] [address] [phone]  [facility]  Pending - Request Sent [address], [place] [address] [phone]  [facility]  Pending - Request Sent [address] [place] [address] [phone]  [facility]  Pending - Request Sent [address], [place] [address] [phone]  [facility]  Pending - Request Sent [address], [place] [address] [phone]  [facility]  Pending - Request Sent [address] [place] [address] [phone]      Situation ongoing, CSW to continue following and update chart as more information becomes available.    Albertus Alt MSW, LCSWA 09/19/2023  8:51PM

## 2023-09-19 NOTE — Progress Notes (Signed)
 Inpatient Psychiatric Referral  Patient was recommended inpatient per Arvell Latin, NP . There are no available beds at [facility], per [facility] Vanessa General, RN . Patient was re-faxed to the following out of network facilities:  Destination  Service Provider Request Status Address Phone Fax  [facility]  Pending - Request Sent [address] [place] [place] [address] [phone]  [facility]  Pending - Request [facility] [address] [place] [address] [phone]  [facility]  Pending - Request Sent [address], [place] [address] [phone]  [facility]  Pending - Request Sent [address] [place], [place] [address] [phone]  [facility] Patient Placement  Pending - Request [facility], [place] [phone]  [facility]  Pending - Request Sent [address] Johnella Naas [place] [address] [phone]  [facility]  Pending - Request Sent [address] Sharren Decree [place] [address] [phone]  [facility]  Pending - Request Sent [address] Sharren Decree [place] [phone]  [facility]  Pending - Request Sent [address] [place] [address] [phone]  [facility]  Pending - Request Sent [address] Shelva Dice [place] [address] [phone]  [facility]  Pending - Request Sent [address] [place] [address] [phone]  [facility] [place]  Pending - Request [place] [address] [phone]  [facility]  Pending - Request [facility] Josephina Nicks [place] [address] [phone]   [facility]  Pending - Request Sent [address] [place], [place] [address] [phone]  [facility]  Pending - Request Sent [address] [place] [address] [phone]  [facility]  Pending - Request Sent [address] [place] [address] [phone]  [facility]  Pending - Request Sent [address] [place] [address] [phone]  [facility]  Pending - Request Sent [address], [place] [address] [phone]  [facility]  Pending - Request Sent [address], [place] [address] [phone]  [facility]  Pending - Request Sent [address] [place] [address] [phone]    Situation ongoing, CSW to continue following and update chart as more information becomes available.   Albertus Alt MSW, LCSWA 09/19/2023  12:10PM

## 2023-09-19 NOTE — Consult Note (Addendum)
 [facility] Psychiatric Consult Initial  Patient Name: .Stephen Robinson  MRN: [id number]  DOB: 01/15/2001  Consult Order details:  Orders (From admission, onward)     Start     Ordered   09/17/23 2010  CONSULT TO CALL ACT TEAM       Ordering Provider: Lind Repine, MD  Provider:  (Not yet assigned)  Question:  Reason for Consult?  Answer:  Psych consult   09/17/23 2009             Mode of Visit: In person    Psychiatry Consult Evaluation  Service Date: September 19, 2023 LOS:  LOS: 0 days  Chief Complaint Attempted jumping off a roof, Suicide ideation,   Primary Psychiatric Diagnoses  Single  Major depressive disorder, severe without Psychotic features 2.  Suicide ideation  Assessment  Stephen Robinson is a 23 y.o. male admitted: Presented to the EDfor 09/17/2023  5:54 PM for ttempted jumping off a roof, Suicide ideation, . He carries the psychiatric diagnoses of ADHD, OCD, mild intellectual disability  and has a past medical history of  none.   His current presentation of suicide ideation and severe depression  is most consistent with under treatment of Depression . He meets criteria for inpatient Psychiatry hospitalization based on symptom presented and report from group home.  Current outpatient psychotropic medications include Clozaril , Klonopin , Haldol , Depakote , intuniv  and historically he has had a minimal response to these medications. He was  compliant with medications prior to admission as evidenced by report from group home. On initial examination, patient calm ans cooperative and still endorses suicide ideation with no plan. Please see plan below for detailed recommendations.   Diagnoses:  Active Hospital problems: Active Problems:   * No active hospital problems. *    Plan   ## Psychiatric Medication Recommendations:  Resume home Medications. Discontinue Haldol  Start Olanzapine  5 mg noon daily for mood  ## Medical Decision Making Capacity: Patient has  a guardian and has thus been adjudicated incompetent; please involve patients guardian in medical decision making  ## Further Work-up:  -- most recent EKG in progress. -- Pertinent labwork reviewed earlier this admission includes: CMP, CBC, Alcohol level   ## Disposition:-- We recommend inpatient psychiatric hospitalization after medical hospitalization. Patient has been involuntarily committed on 09/17/2023.   ## Behavioral / Environmental: - No specific recommendations at this time.     ## Safety and Observation Level:  - Based on my clinical evaluation, I estimate the patient to be at Moderate  risk of self harm in the current setting. - At this time, we recommend  routine. This decision is based on my review of the chart including patient's history and current presentation, interview of the patient, mental status examination, and consideration of suicide risk including evaluating suicidal ideation, plan, intent, suicidal or self-harm behaviors, risk factors, and protective factors. This judgment is based on our ability to directly address suicide risk, implement suicide prevention strategies, and develop a safety plan while the patient is in the clinical setting. Please contact our team if there is a concern that risk level has changed.  CSSR Risk Category:Stephen-SSRS RISK CATEGORY: High Risk  Suicide Risk Assessment: Patient has following modifiable risk factors for suicide: active suicidal ideation, untreated depression, and under treated depression , which we are addressing by recommending inpatient Psychiatry hospitalization. Patient has following non-modifiable or demographic risk factors for suicide: male gender, history of suicide attempt, and psychiatric hospitalization Patient has the following protective factors against suicide:  Access to outpatient mental health care, Supportive family, and Supportive friends  Thank you for this consult request. Recommendations have been communicated  to the primary team.  We will will continue to round on patient until he secures a bed in the inpatient Psychiatry hospitalization. at this time.   Stephen Robinson Stephen Shubham Thackston, [name]       History of Present Illness  Relevant Aspects of Hospital ED Course:  Admitted on 09/17/2023 for ttempted jumping off a roof, Suicide ideation,   Patient Report: 23 years old male patient brought in from a group home under IVC for attempting to jump off the roof as a means to kill himself.  Patient on arrival stated he is feeling suicidal because he has been getting in trouble at the group home. Per IVC Petition: PER IVC Respondent attempted o enter a neighbors house. Respondent attempted jumping a fence in the backyard. Respondent was very aggressive with law enforcement. Respondent stated he was going to kill himself. Respondent is a danger to self. Respondent is a danger to others.  Yesterday patient was calm and cooperative but constantly pacing and asking when he is going to be admitted in the Psychiatry unit.  Today he remains suicidal with no definite plan.  Patient has multiple hx of suicide attempts and self harm behavior.  Provider reached out to his caregiver/group home owner who reports that patient lately has been aggressive towards staff and Police officers.  He runs away from the facility as well.   Ms Stephen Robinson added that patient is compliant with his medications but his Medications are very old and no longer effective.  Patient is on Clozaril  and Haldol , he is drooling taking only 200 mg a day and there is no room to increase this dose.  Haldol  is discontinued as Stephen Robinson complained that he cannot close his mouth and this is the reason for his drooling.  Drooling is a side effect of Clozaril .  Provider attempted to speak with out patient provider regarding the Clozaril .  Haldol  is discontinued this morning and Olanzapine  added for afternoon every day.  Patient denies HI/AVH this morning.  He is easily  retractably and willing to go inpatient for proper treatment.  We have faxed out records out to hospitals for bed placement.  We will continue to manage Medications here in the ER until he secures a bed in the Psychiatry unit. ADDENDUM 15:47 Provider spoke to patient's outpatient Psychiatrist-Ernest Felipe Horton and we reviewed changes made to patient's Medications while in the ER.  Provider informed DR Felipe Horton that Haldol  is discontinued due Stephen/o of inability for patient to close his mouth and jaw pain.  Olanzapine  low dose started at 5 mg at noon time with plan to increase if tolerated.  Provider also informed DR Felipe Horton that the ideal changes will be to increase Clozaril  from 100 mg twice to a slightly higher dose but since patient is drooling much and DR Felipe Horton is in agreement not to increase Clozaril  at this time.  He is in agreement to start and gradually increase Olanzapine  which will help to control agitation as well.  Psych ROS:  Depression: YES,  Anxiety:  YES Mania (lifetime and current): NA Psychosis: (lifetime and current): NA  Collateral information:  Contacted Stephen Robinson, group home reports patient is aggressive to staff at the group home.  She also added that patient lately runs away from the group home.  She reports patient takes his Medications when he is in good mood.  Review of  Systems  Constitutional: Negative.   HENT: Negative.    Eyes: Negative.   Respiratory: Negative.    Cardiovascular: Negative.   Gastrointestinal: Negative.   Genitourinary: Negative.   Musculoskeletal: Negative.   Skin: Negative.   Neurological: Negative.   Psychiatric/Behavioral: Negative.       Psychiatric and Social History  Psychiatric History:  Information collected from patient, chart   Prev Dx/Sx: OCD, ADHD Current Psych Provider: yes Home Meds (current): see mar Previous Med Trials: unknown Therapy: yes   Prior Self Harm: denies Prior Violence: hx of aggressive behaviors     Social  History:  Developmental Hx: mild intellectual disabiity  Access to weapons/lethal means: denies    Substance History Alcohol: denies  Tobacco: denies Illicit drugs: denies Prescription drug abuse: denies Rehab hx: denies   Exam Findings  Physical Exam:  Vital Signs:  Temp:  [97.6 F (36.4 Stephen)-98 F (36.7 Stephen)] 97.6 F (36.4 Stephen) (06/16 0652) Pulse Rate:  [68-80] 68 (06/16 0652) Resp:  [16-18] 16 (06/16 0652) BP: (114-135)/(74-80) 114/74 (06/16 0652) SpO2:  [99 %-100 %] 100 % (06/16 0652) Blood pressure 114/74, pulse 68, temperature 97.6 F (36.4 Stephen), temperature source Oral, resp. rate 16, SpO2 100%. There is no height or weight on file to calculate BMI.  Physical Exam Vitals and nursing note reviewed.  Constitutional:      Appearance: Normal appearance.  HENT:     Nose: Nose normal.   Cardiovascular:     Rate and Rhythm: Normal rate and regular rhythm.  Pulmonary:     Effort: Pulmonary effort is normal.   Musculoskeletal:        General: Normal range of motion.   Skin:    General: Skin is dry.   Neurological:     Mental Status: He is alert and oriented to person, place, and time.   Psychiatric:        Attention and Perception: Attention and perception normal.        Mood and Affect: Mood is anxious and depressed.        Speech: Speech normal.        Behavior: Behavior normal. Behavior is cooperative.        Thought Content: Thought content includes suicidal ideation.        Judgment: Judgment is impulsive.     Mental Status Exam: General Appearance: Casual and Neat  Orientation:  Full (Time, Place, and Person)  Memory:  Immediate;   Fair Recent;   Fair Remote;   Fair  Concentration:  Concentration: Fair and Attention Span: Fair  Recall:  Fair  Attention  Good  Eye Contact:  Good  Speech:  Normal Rate, Slurred, and Constantly drooling  Language:  Fair  Volume:  Normal  Mood: OK, I feel sad  Affect:  Congruent  Thought Process:  Coherent  Thought  Content:  WDL  Suicidal Thoughts:  Yes.  without intent/plan  Homicidal Thoughts:  No  Judgement:  Fair  Insight:  Fair  Psychomotor Activity:  Restlessness  Akathisia:  NA  Fund of Knowledge:  Fair      Assets:  Manufacturing systems engineer Desire for Improvement Housing Physical Health Social Support  Cognition:  WNL  ADL's:  Intact  AIMS (if indicated):        Other History   These have been pulled in through the EMR, reviewed, and updated if appropriate.  Family History:  The patient's family history is not on file.  Medical History: Past Medical History:  Diagnosis  Date  . ADHD   . OCD (obsessive compulsive disorder)     Surgical History: No past surgical history on file.   Medications:   Current Facility-Administered Medications:  .  atorvastatin (LIPITOR) tablet 40 mg, 40 mg, Oral, Daily, Stephen Repine, MD, 40 mg at 09/19/23 1610 .  clonazePAM  (KLONOPIN ) tablet 1 mg, 1 mg, Oral, BID, Karnisha Lefebre C, NP, 1 mg at 09/19/23 9604 .  cloZAPine  (CLOZARIL ) tablet 100 mg, 100 mg, Oral, BID, Stephen Repine, MD, 100 mg at 09/19/23 5409 .  divalproex  (DEPAKOTE  ER) 24 hr tablet 750 mg, 750 mg, Oral, BID, Messick, Peter C, MD, 750 mg at 09/19/23 8119 .  guanFACINE  (INTUNIV ) ER tablet 4 mg, 4 mg, Oral, Daily, Stephen Repine, MD, 4 mg at 09/19/23 0930 .  haloperidol  (HALDOL ) tablet 5 mg, 5 mg, Oral, BID, Cataleyah Colborn C, NP, 5 mg at 09/19/23 0924 .  ipratropium (ATROVENT) 0.06 % nasal spray 2 spray, 2 spray, Each Nare, TID, Stephen Repine, MD, 2 spray at 09/19/23 1478 .  levothyroxine  (SYNTHROID ) tablet 25 mcg, 25 mcg, Oral, Daily, Stephen Repine, MD, 25 mcg at 09/19/23 2956 .  LORazepam  (ATIVAN ) tablet 1 mg, 1 mg, Oral, Q4H PRN, Stephen Repine, MD, 1 mg at 09/18/23 2142 .  metoprolol  succinate (TOPROL -XL) 24 hr tablet 12.5 mg, 12.5 mg, Oral, Daily, Stephen Repine, MD, 12.5 mg at 09/19/23 0927 .  senna (SENOKOT) tablet 17.2 mg, 2 tablet, Oral, Daily, Stephen Repine, MD, 17.2  mg at 09/19/23 2130  Current Outpatient Medications:  .  Ascorbic Acid (VITAMIN Stephen) 500 MG CHEW, Chew 1 tablet by mouth in the morning., Disp: , Rfl:  .  atorvastatin (LIPITOR) 40 MG tablet, Take 40 mg by mouth daily., Disp: , Rfl:  .  cariprazine (VRAYLAR) 1.5 MG capsule, Take 1.5 mg by mouth at bedtime., Disp: , Rfl:  .  cholecalciferol  (VITAMIN D3) 25 MCG (1000 UNIT) tablet, Take 1,000 Units by mouth in the morning., Disp: , Rfl:  .  clonazePAM  (KLONOPIN ) 0.5 MG tablet, Take 1 mg by mouth 2 (two) times daily., Disp: , Rfl:  .  cloZAPine  (CLOZARIL ) 100 MG tablet, Take 100 mg by mouth 2 (two) times daily., Disp: , Rfl:  .  diphenhydrAMINE  (BENADRYL ) 50 MG capsule, Take 50 mg by mouth at bedtime., Disp: , Rfl:  .  divalproex  (DEPAKOTE  ER) 250 MG 24 hr tablet, Take 250 mg by mouth in the morning and at bedtime. Takes with the 500 mg tablet = 750 mg, Disp: , Rfl:  .  divalproex  (DEPAKOTE  ER) 500 MG 24 hr tablet, Take 500 mg by mouth in the morning and at bedtime. Total dose 750 mg, Disp: , Rfl:  .  ferrous sulfate 325 (65 FE) MG tablet, Take 325 mg by mouth daily at 6 (six) AM., Disp: , Rfl:  .  guanFACINE  (INTUNIV ) 4 MG TB24 ER tablet, Take 1 tablet (4 mg total) by mouth at bedtime. (Patient taking differently: Take 4 mg by mouth daily.), Disp: 30 tablet, Rfl: 2 .  haloperidol  (HALDOL ) 5 MG tablet, Take 10 mg by mouth daily at 6 (six) AM., Disp: , Rfl:  .  ipratropium (ATROVENT) 0.06 % nasal spray, Place 2 sprays into both nostrils 3 (three) times daily., Disp: , Rfl:  .  levothyroxine  (SYNTHROID ) 25 MCG tablet, Take 1 tablet (25 mcg total) by mouth daily., Disp: , Rfl:  .  metoprolol  succinate (TOPROL -XL) 25 MG 24 hr tablet, Take 12.5 mg by mouth daily., Disp: , Rfl:  .  mirtazapine (REMERON) 7.5 MG tablet, Take 7.5 mg by mouth at bedtime., Disp: , Rfl:  .  senna (SENOKOT) 8.6 MG TABS tablet, Take 2 tablets by mouth in the morning and at bedtime., Disp: , Rfl:   Allergies: Allergies  Allergen  Reactions  . Seroquel [Quetiapine] Swelling    Annaelle Kasel Stephen Squire Withey, NP

## 2023-09-20 LAB — CLOZAPINE (CLOZARIL)
Clozapine Lvl: 198 ng/mL — ABNORMAL LOW (ref 350–600)
NorClozapine: 66 ng/mL
Total(Cloz+Norcloz): 264 ng/mL

## 2023-09-20 NOTE — ED Provider Notes (Addendum)
 Emergency Medicine Observation Re-evaluation Note  Stephen Robinson is a 23 y.o. male, seen on rounds today.  Pt initially presented to the ED for complaints of IVC Currently, the patient is not having acute complaints.  Physical Exam  BP 117/70 (BP Location: Left Arm)   Pulse 80   Temp 98 F (36.7 C) (Oral)   Resp 17   SpO2 97%  Physical Exam General: Walking about unit Lungs: Normal work of breathing Psych: Calm  ED Course / MDM  EKG:   I have reviewed the labs performed to date as well as medications administered while in observation.  Recent changes in the last 24 hours include seen by psychiatry who recommends inpatient treatment.  Plan  Current plan is for inpatient psychiatric placement.    Ninetta Basket, MD 09/20/23 1610    Ninetta Basket, MD 09/20/23 [phone]

## 2023-09-20 NOTE — ED Notes (Addendum)
 [facility] called [employer] (Corwyn Sergeant), pts legal guardian organization to inform them that pt has been accepted to [facility] for inpatient psychiatric admission and will be transferred there tomorrow morning. Mr. Lino Richmond requested that information regarding pts admission be emailed to [email or web address]. [facility] emailed the information that is to be forwarded to pts guardian.  [facility] was notified of the name of pts legal guardian Angelica Bard) from [place] [facility] [employer]. [facility] called Ms. Germain Kohler to notify her of pts admission to [facility].   Patt Boozer, [facility]  09/20/23

## 2023-09-20 NOTE — ED Notes (Signed)
 Patient sleeping. Will get VSs when he wakes up. Throughout shift he has been calm, cooperative and medication compliant while awake.

## 2023-09-20 NOTE — Progress Notes (Signed)
 Pt has been accepted to [facility] TODAY Bed assignment: Main campus  Pt meets inpatient criteria per: Arvell Latin NP  Attending Physician will be Lavona Pounds, MD  Report can be called to: [phone] (this is a pager, please leave call-back number when giving report)  Pt can arrive ASAP   Care Team Notified: Arvell Latin NP, Mabeline Savant RN  [place] Lopez Dentinger LCSW-A   09/20/2023 1:33 PM

## 2023-09-20 NOTE — ED Notes (Signed)
 Called report called to [facility]. Transport arranged for tomorrow morning with the [facility] department.

## 2023-09-20 NOTE — Progress Notes (Signed)
 LCSW Progress Note  [id number]   Cornelis Kluver  09/20/2023  1:07 PM  Description:   Inpatient Psychiatric Referral  Patient was recommended inpatient per Arvell Latin NP. There are no available beds at [facility], per [facility] Kathryn Parish RN. Patient was referred to the following out of network facilities:   Destination  Service Provider Address Phone Fax  [facility]  [address] [place] [place] [address] [phone]  [facility]  [address] [place] [address] [phone]  [facility]  [address] [place] [address] [phone]  [facility]  [address] [place], [place] [address] [phone]  [facility] Patient Placement  [facility], [place] [phone]  [facility]  [address] [place]ella [place] [address] [phone]  [facility]  [address] [place] [address] [phone]  [facility] EFAX  [address], [place] [phone]  [facility]  [address], [place] [address] [phone]  [facility]  [address] [place] [address] [phone]  [facility]  [address] [place] [address] [phone]  [facility] [address]  [place] [address] [phone]  [facility]  [address] [facility] Josephina Nicks [place] [address] [phone]  [facility]  [address] [place], [place] [address] [phone]  [facility]  [address] [place] [address] [phone]  [facility]  [address] [place] [address] [phone]  [facility]  [address] [place] [address] [phone]  [facility]  [address], [place] [address] [phone]  [facility]  [address], [place] [address] [phone]  [facility]  [address] [place] [address] [phone]      Situation ongoing, CSW to continue following and update chart as more information becomes available.     [place] Temperance Kelemen, MSW, LCSW  09/20/2023 1:07 PM

## 2023-09-21 NOTE — ED Provider Notes (Signed)
 Emergency Medicine Observation Re-evaluation Note  Stephen Robinson is a 23 y.o. male, seen on rounds today.  Pt initially presented to the ED for complaints of IVC Currently, the patient is accepted for inpatient treatment at [facility].  Patient has no complaints this morning.  Physical Exam  BP 109/76   Pulse 94   Temp 98.2 F (36.8 C) (Oral)   Resp 18   SpO2 97%  Physical Exam General: Alert and well appearance.  Up and ambulating in the halls drinking. Cardiac: Regular. Lungs: Clear to auscultation. Psych: Calm and situationally appropriate  ED Course / MDM  EKG:EKG Interpretation Date/Time:  Monday September 19 2023 21:24:31 EDT Ventricular Rate:  53 PR Interval:  144 QRS Duration:  96 QT Interval:  424 QTC Calculation: 397 R Axis:   98  Text Interpretation: Sinus bradycardia with sinus arrhythmia Rightward axis Nonspecific T wave abnormality Abnormal ECG No previous ECGs available Confirmed by Lowery Rue [phone]) on 09/20/2023 3:01:42 PM  I have reviewed the labs performed to date as well as medications administered while in observation.  Recent changes in the last 24 hours include accepted for admission.  Plan  Current plan is for EMTALA completed for transfer.    Wynetta Heckle, MD 09/21/23 [phone]

## 2023-10-27 NOTE — Progress Notes (Signed)
 [facility] 10/27/23  Dental procedures in this visit  . D2140 - AMALGAM 1 SURFACE 19 O   SUBJECTIVE Chief complaint: Patient reports with caries lesion(s) in need of restorative treatment.  Health History? Medical History[1] Medications Ordered Prior to Encounter[2] Allergies[3]  Problem List[4]  Surgical History[5] Family History[6] Social History[7]  Medical Risk Assessment ASA GRADE: ASA 2 - Patient with mild systemic disease with no functional limitations  Medical history was reviewed and updated. No contraindication to care.  Pain Level Tooth/teeth to be treated testing asymptomatic. Pain score currently: 0 (0-10 scale)  Patient's expectations: Patient understands the procedure and expects a functional and esthetically pleasing restoration.  OBJECTIVE Vital Signs BP Readings from Last 1 Encounters:  10/27/23 119/60  Pulse:    Pre-procedure Examination Tooth #19 being treated for the following condition: Caries  Pre-procedure Radiograph/Intra-oral Image Date taken:   Radiograph Entry:   Procedure ANESTHESIA Topical anesthetic: Benzocaine  20% Lidocaine 2% epi: 1:100K x Carpules: 1 carpule;  ISOLATION Cotton rolls isolation PREPARATION Caries excavated BASE/LINER Gluma MATRIX No matrix used RESTORATIVE MATERIALS Amalgam: NA  FINISHING/POLISHING Occlusion checked and adjusted Restoration finished using burnisher.  Post-procedure examination: Uneventful procedure, goal with procedure achieved.  ASSESSMENT Diagnosis: Caries, restorable Prognosis: Good  PLAN Post-operative Instructions Patient informed about possible short-term sensitivity to cold.  Provider should be contacted in the event of severe sensitivity or any sensitivity to heat, and/or pain on biting. Patient advised to be careful with soft tissues on the anesthetized side until the anesthesia wears off.  Patient Education Patient educated on proper oral hygiene  techniques and the importance of regular dental check-ups in order to maximize the longevity of the restoration.    Next visit: 01/30/24 hygiene  Treatment Providers Greig Metro, DA I Hampton Slade, DDS [facility] [address] [place], [place]  [address] [phone]        [1] Past Medical History: Diagnosis Date  . Bipolar disorder   [2] Current Outpatient Medications on File Prior to Visit  Medication Sig Dispense Refill  . acetaminophen  (TYLENOL  8 HOUR) 650 mg 8 hr tablet     . atorvastatin  (LIPITOR) 40 mg tablet     . Banophen  50 mg capsule     . cholecalciferol  (VITAMIN D-3) 25 mcg (1,000 unit) tablet Take 1,000 Units by mouth.    . ClearLax 17 gram/dose powder     . clonazePAM  (KlonoPIN ) 0.5 mg tablet     . clonazePAM  (KlonoPIN ) 0.5 mg tablet Take 1 mg by mouth.    . clonazePAM  (KlonoPIN ) 1 mg tablet     . clonazePAM  (KlonoPIN ) 1 mg tablet Take 1 mg by mouth.    . cloZAPine  (CLOZARIL ) 100 mg tablet     . cloZAPine  (CLOZARIL ) 25 mg tablet     . cloZAPine  (CLOZARIL ) 25 mg tablet Take 75 mg by mouth.    . cloZAPine  (CLOZARIL ) 50 mg tablet Take 150 mg by mouth.    . Compound W 17 % gel     . Deep Sea Nasal 0.65 % nasal spray     . diphenhydrAMINE  (BENADRYL ) 25 mg capsule     . diphenhydrAMINE  (BENADRYL ) 50 mg capsule Take 50 mg by mouth.    . diphenhydrAMINE  (SOMINEX) 25 mg tablet Take 25-50 mg by mouth.    . divalproex  (DEPAKOTE ) 250 mg 24 hr tablet     . divalproex  (DEPAKOTE ) 250 mg 24 hr tablet Take 250 mg by mouth in the morning  and 250 mg in the evening.    . divalproex  (DEPAKOTE ) 250 mg EC tablet     . divalproex  (DEPAKOTE ) 500 mg 24 hr tablet     . divalproex  (DEPAKOTE ) 500 mg 24 hr tablet Take 500 mg by mouth in the morning and 500 mg in the evening.    . FeroSuL 325 mg (65 mg iron) tablet 65 mg.    . fluticasone  propionate (FLONASE ) 50 mcg/actuation nasal spray     . fluticasone  propionate (FLONASE ) 50 mcg/actuation  nasal spray Administer 1 spray into affected nostril(s).    SABRA glycopyrrolate (ROBINUL) 1 mg tablet 1 mg.    . guanFACINE  (INTUNIV ) 4 mg 24 Hour ER tablet     . haloperidoL  (HALDOL ) 10 mg tablet     . haloperidoL  (HALDOL ) 10 mg tablet Take 10 mg by mouth.    SABRA HYDROcodone-acetaminophen  (NORCO) 5-325 mg tablet     . ipratropium (ATROVENT ) 42 mcg (0.06 %) nasal spray     . ipratropium (ATROVENT ) 42 mcg (0.06 %) nasal spray Administer 2 sprays into affected nostril(s).    SABRA ipratropium-albuteroL (DUO-NEB) 0.5-2.5 mg/3 mL nebulizer solution     . levothyroxine  (SYNTHROID , UNITHROID ) 25 mcg tablet     . levothyroxine  (SYNTHROID , UNITHROID ) 25 mcg tablet Take 25 mcg by mouth.    . LORazepam  (ATIVAN ) 2 mg tablet     . LORazepam  (ATIVAN ) 2 mg tablet Take 4 mg by mouth.    . metoprolol  succinate (TOPROL -XL) 25 mg 24 hr tablet     . metoprolol  succinate (TOPROL -XL) 25 mg 24 hr tablet Take 12.5 mg by mouth.    . mirtazapine (REMERON) 7.5 mg tablet 7.5 mg.    . OLANZapine  (ZyPREXA ) 5 mg tablet Take 5 mg by mouth.    . OLANZapine  (ZyPREXA ) 5 mg tablet Take 5 mg by mouth.    . polyethylene glycol (GLYCOLAX ) 17 gram packet Take 17 g by mouth.    . senna (SENOKOT) 8.6 mg tablet Take 2 tablets by mouth in the morning and 2 tablets in the evening.    . senna 8.6 mg tablet     . sodium chloride  0.65 % drops Administer 1 spray into affected nostril(s).    . Vitamin D3 25 mcg (1,000 unit) tablet      No current facility-administered medications on file prior to visit.  [3] Allergies Allergen Reactions  . Quetiapine Swelling  [4] Patient Active Problem List Diagnosis  . ADHD (attention deficit hyperactivity disorder), combined type  . Aggressive behavior of adult  . Involuntary commitment  . Mild intellectual disability  . Stress reaction causing mixed disturbance of emotion and conduct  [5] No past surgical history on file. [6] Family History Problem Relation Name Age of Onset  . No Known  Problems Mother    . No Known Problems Father    [7] Social History Tobacco Use  . Smoking status: Never  . Smokeless tobacco: Never  Substance Use Topics  . Alcohol use: Never  . Drug use: Never

## 2023-12-28 ENCOUNTER — Ambulatory Visit (HOSPITAL_COMMUNITY)
Admission: EM | Admit: 2023-12-28 | Discharge: 2023-12-28 | Disposition: A | Discharge status: Home / Self Care | Payer: MEDICAID | Attending: Psychiatry | Admitting: Psychiatry

## 2023-12-28 DIAGNOSIS — Z765 Malingerer [conscious simulation]: Secondary | ICD-10-CM | POA: Insufficient documentation

## 2023-12-28 NOTE — ED Provider Notes (Addendum)
 Behavioral Health Urgent Care Medical Screening Exam  Patient Name: Stephen Robinson MRN: [id number] Date of Evaluation: 12/28/23 Chief Complaint:  irritability for secondary gains.  Diagnosis:  Final diagnoses:  Malingering   History of Present illness: Stephen Robinson is a 23 y.o. male with a history of ADHD, OCD. As per triage: Pt states he is feeling suicidal and wants to go to [facility]. Pt states he last felt suicidal 4 days ago with no plan. When asked if he plans to hurt himself pt says no because if i say yes, y'all wont let me go. this Clinical research associate asked the patient again to which he responded yes. Pt states he had 1 beer yesterday evening. Per [employer]: pt is suicidal, diagnosed with schizophrenia and bipolar disorder. currently trying to contact current legal guardian  Assessment and review of psychiatry symptoms: During encounter with patient, he states I feel claustrophobic where I stay, and I want to be here for a few days, seems to be malingering for secondary gains of hospitalization.  Denies SI, denies HI, denies AVH.  Denies paranoia, denies delusional thinking, even though speech is somewhat garbled, and sometimes difficult to comprehend.   Writer reached out to patient's legal guardian in an effort to get collateral information, to make a determination regarding if hospitalization is necessary.  Spoke with Stephania Lapine) (Legal Guardian)- [phone] [facility] Phone) -Ms. Lapine shares that patient has been using vapes which are not authorized at his day program, and whenever does vape's assistance from him, he throws a tantrum, and tends to use suicidal ideations as a means of gaining access to hospitalization and staying hospitalized times a few days, after which he is typically fine.  Writer explained to Ms. Paige that during encounter with patient he did not verbalize suicidal ideations, is currently calm and cooperative, and hospitalization is currently not warranted.   Writer informed this patient that patient is psychiatrically cleared for discharge, and that we will be discharging him from the [facility] behavioral health center.  Ms. page had no safety concerns.  Asked Clinical research associate to call Dania who is patient's Dietitian at [phone] and ask for patient to be picked up.  Writer called Ms. Kiana at [phone], advised them of the above, regarding patient's discharge from this facility.  Also educated of the fact that patient was psychiatrically stable for discharge and management in the community at this time.  Educated that no medications were administered to patient at our facility.  They advised that they will present within the hour to pick patient up.  He has now been discharged.  Awaiting pickup.  No medications ordered, patient is to continue current home medications, and follow-up with his outpatient provider.  Flowsheet Row ED from 12/28/2023 in [facility] ED from 09/17/2023 in [facility] Emergency Department at [facility] ED from 06/21/2023 in [facility] Emergency Department at [facility]  C-SSRS RISK CATEGORY High Risk High Risk Error: Question 6 not populated    Psychiatric Specialty Exam  Presentation  General Appearance:Casual  Eye Contact:Fair  Speech:Clear and Coherent  Speech Volume:Normal  Handedness:Right   Mood and Affect  Mood:Euthymic  Affect:Congruent   Thought Process  Thought Processes:Coherent  Descriptions of Associations:Intact  Orientation:Full (Time, Place and Person)  Thought Content:Logical  Diagnosis of Schizophrenia or Schizoaffective disorder in past: No   Hallucinations:None  Ideas of Reference:None  Suicidal Thoughts:No  Homicidal Thoughts:No   Sensorium  Memory:Immediate Fair  Judgment:Fair  Insight:Fair   Executive  Functions  Concentration:Fair  Attention Span:Fair  Recall:Fair  Fund of  Knowledge:Fair  Language:Fair   Psychomotor Activity  Psychomotor Activity:Normal   Assets  Assets:Resilience   Sleep  Sleep:Fair  Number of hours: No data recorded  Physical Exam: Physical Exam Vitals and nursing note reviewed.  Eyes:     Pupils: Pupils are equal, round, and reactive to light.  Musculoskeletal:     Cervical back: Normal range of motion.  Skin:    General: Skin is warm.  Neurological:     General: No focal deficit present.     Mental Status: He is oriented to person, place, and time.    Review of Systems  Psychiatric/Behavioral:  Positive for depression and substance abuse. Negative for hallucinations, memory loss and suicidal ideas. The patient is not nervous/anxious and does not have insomnia.   All other systems reviewed and are negative.  Blood pressure 106/67, pulse 98, temperature 98.4 F (36.9 C), resp. rate 20, SpO2 99%. There is no height or weight on file to calculate BMI.  Musculoskeletal: Strength & Muscle Tone: within normal limits Gait & Station: normal Patient leans: N/A   [facility] MSE Discharge Disposition for Follow up and Recommendations: Based on my evaluation the patient does not appear to have an emergency medical condition and can be discharged with resources and follow up care in outpatient services for Medication Management and Individual Therapy [place] up with outpatient provider for med management and therapy  Donia Snell, NP 12/28/2023, 1:41 PM
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
## 2023-12-28 NOTE — ED Notes (Signed)
 Pt told this write if he gets out of here he's gonna kill someone

## 2023-12-28 NOTE — Discharge Instructions (Signed)
 Please present back to your outpatient mental health provider for continuity of care of mental health services. You may return back to this location if in an emergency/crises situation.  Get help right away if: You have thoughts about hurting yourself or others. Get help right away if you feel like you may hurt yourself or others, or have thoughts about taking your own life. Go to your nearest emergency room or: Call 911. Call the National Suicide Prevention Lifeline at 939-616-1801 or 988 in the [place] This is open 24 hours a day. If you're a Veteran: Call 988 and press 1. This is open 24 hours a day. Text the [employer] at (518)527-7534. Summary Mental health is not just the absence of mental illness. It involves understanding your emotions and behaviors, and taking steps to manage them in a healthy way. If you have symptoms of mental or emotional distress, get help from family, friends, a health care provider, or a mental health professional. Practice good mental health behaviors such as stress management skills, self-calming skills, exercise, healthy sleeping and eating, and supportive relationships. This information is not intended to replace advice given to you by your health care provider. Make sure you discuss any questions you have with your health care provider.  Education provided on the fact that if experiencing worsening of psychiatry symptoms including suicidal ideations, homicidal ideations, or having auditory/visual hallucinations, etc, to call 911, 988, come back to this location, or go to the nearest ER. Pt verbalized understanding.

## 2023-12-28 NOTE — ED Notes (Signed)
 Patient discharged per provider order. Patient belongings returned complete and intact. AVS reviewed with caregiver. No concerns at this time.

## 2024-01-31 NOTE — Progress Notes (Signed)
 [facility] 01/30/24 No primary care provider on file. Treatment Providers Annabella Shouts, [facility]  Dental procedures in this visit   D0120 - PERIODIC ORAL EVALUATION - ESTABLISHED PATIENT (Completed)    Service provider: Hampton Slade, DDS    Billing provider: Hampton Slade, DDS   [phone] - ILDA BIRKENHEAD (Completed)    Service provider: Annabella Shouts, [facility]    Billing provider: Hampton Slade, DDS    HEALTH HISTORY ? Vitals:  BP Readings from Last 1 Encounters:  01/30/24 96/64    Pulse:  88 Medical history was reviewed and updated. No contraindication to care. Medical History[1] Surgical History[2] Social History   Tobacco Use   Smoking status: Never   Smokeless tobacco: Never  Substance Use Topics   Alcohol use: Never   Family History[3] Medications Ordered Prior to Encounter[4] Medical Risk Assessment ASA GRADE: ASA 3 - Patient with moderate systemic disease with functional limitations  Subjective: Patient presents for POE.  History of presenting condition: Pt has hx of mod-severe gingivitis ?Pain score currently: 0  Objective:  Radiographs taken: No radiographs captured at this appointment:   ROS: all other systems negative for symptoms Intraoral exam completed and findings: WNL. Extraoral exam completed and findings: WNL. Periodontal findings:  .    Assessment: Diagnosis: After thorough clinical and radiographic examination, the patient was advised of the following diagnosis:  Generalized periodontal disease   Risks and potential complications: Discussed.  Plan: Treatment options:   Treatment options related to findings reviewed and all questions from patient answered.   Patient expressed understanding of all that was discussed.  PRESCRIPTION: none  NV: Referred to [facility] for third molar EXT and PA sent for SRO  Treatment Providers Dental Assistant: Almetta Finder, DAI Annabella Shouts, [facility] [address] [place] [address] [phone]         [1] Past Medical History: Diagnosis Date   Bipolar disorder   [2] History reviewed. No pertinent surgical history. [3] Family History Problem Relation Name Age of Onset   No Known Problems Mother     No Known Problems Father    [4] Current Outpatient Medications on File Prior to Visit  Medication Sig Dispense Refill   acetaminophen  (TYLENOL  8 HOUR) 650 mg 8 hr tablet      atorvastatin  (LIPITOR) 40 mg tablet      Banophen  50 mg capsule      cholecalciferol  (VITAMIN D-3) 25 mcg (1,000 unit) tablet Take 1,000 Units by mouth.     ClearLax 17 gram/dose powder      clonazePAM  (KlonoPIN ) 0.5 mg tablet      clonazePAM  (KlonoPIN ) 0.5 mg tablet Take 1 mg by mouth.     clonazePAM  (KlonoPIN ) 1 mg tablet      clonazePAM  (KlonoPIN ) 1 mg tablet Take 1 mg by mouth.     cloZAPine  (CLOZARIL ) 100 mg tablet      cloZAPine  (CLOZARIL ) 25 mg tablet      cloZAPine  (CLOZARIL ) 25 mg tablet Take 75 mg by mouth.     cloZAPine  (CLOZARIL ) 50 mg tablet Take 150 mg by mouth.     Compound W 17 % gel      Deep Sea Nasal 0.65 % nasal spray      diphenhydrAMINE  (BENADRYL ) 25 mg capsule      diphenhydrAMINE  (BENADRYL ) 50 mg capsule Take 50 mg by mouth.     diphenhydrAMINE  (SOMINEX) 25 mg tablet Take 25-50  mg by mouth.     divalproex  (DEPAKOTE ) 250 mg 24 hr tablet      divalproex  (DEPAKOTE ) 250 mg 24 hr tablet Take 250 mg by mouth in the morning and 250 mg in the evening.     divalproex  (DEPAKOTE ) 250 mg EC tablet      divalproex  (DEPAKOTE ) 500 mg 24 hr tablet      divalproex  (DEPAKOTE ) 500 mg 24 hr tablet Take 500 mg by mouth in the morning and 500 mg in the evening.     FeroSuL 325 mg (65 mg iron) tablet 65 mg.     fluticasone  propionate (FLONASE ) 50 mcg/actuation nasal spray      fluticasone  propionate (FLONASE ) 50 mcg/actuation nasal spray Administer 1 spray into affected  nostril(s).     glycopyrrolate (ROBINUL) 1 mg tablet 1 mg.     guanFACINE  (INTUNIV ) 4 mg 24 Hour ER tablet      haloperidoL  (HALDOL ) 10 mg tablet      haloperidoL  (HALDOL ) 10 mg tablet Take 10 mg by mouth.     HYDROcodone-acetaminophen  (NORCO) 5-325 mg tablet      ipratropium (ATROVENT ) 42 mcg (0.06 %) nasal spray      ipratropium (ATROVENT ) 42 mcg (0.06 %) nasal spray Administer 2 sprays into affected nostril(s).     ipratropium-albuteroL (DUO-NEB) 0.5-2.5 mg/3 mL nebulizer solution      levothyroxine  (SYNTHROID , UNITHROID ) 25 mcg tablet      levothyroxine  (SYNTHROID , UNITHROID ) 25 mcg tablet Take 25 mcg by mouth.     LORazepam  (ATIVAN ) 2 mg tablet      LORazepam  (ATIVAN ) 2 mg tablet Take 4 mg by mouth.     metoprolol  succinate (TOPROL -XL) 25 mg 24 hr tablet      metoprolol  succinate (TOPROL -XL) 25 mg 24 hr tablet Take 12.5 mg by mouth.     mirtazapine (REMERON) 7.5 mg tablet 7.5 mg.     OLANZapine  (ZyPREXA ) 5 mg tablet Take 5 mg by mouth.     OLANZapine  (ZyPREXA ) 5 mg tablet Take 5 mg by mouth.     polyethylene glycol (GLYCOLAX ) 17 gram packet Take 17 g by mouth.     senna (SENOKOT) 8.6 mg tablet Take 2 tablets by mouth in the morning and 2 tablets in the evening.     senna 8.6 mg tablet      sodium chloride  0.65 % drops Administer 1 spray into affected nostril(s).     Vitamin D3 25 mcg (1,000 unit) tablet      No current facility-administered medications on file prior to visit.

## 2024-05-07 ENCOUNTER — Ambulatory Visit (HOSPITAL_COMMUNITY): Admission: EM | Admit: 2024-05-07 | Discharge: 2024-05-08 | Disposition: A | Payer: MEDICAID

## 2024-05-07 DIAGNOSIS — F909 Attention-deficit hyperactivity disorder, unspecified type: Secondary | ICD-10-CM

## 2024-05-07 DIAGNOSIS — R739 Hyperglycemia, unspecified: Secondary | ICD-10-CM

## 2024-05-07 DIAGNOSIS — R45851 Suicidal ideations: Secondary | ICD-10-CM

## 2024-05-07 DIAGNOSIS — F79 Unspecified intellectual disabilities: Secondary | ICD-10-CM

## 2024-05-07 LAB — HEMOGLOBIN A1C
Hgb A1c MFr Bld: 5.1 % (ref 4.8–5.6)
Mean Plasma Glucose: 99.67 mg/dL

## 2024-05-07 LAB — URINALYSIS, ROUTINE W REFLEX MICROSCOPIC
Bilirubin Urine: NEGATIVE
Glucose, UA: NEGATIVE mg/dL
Hgb urine dipstick: NEGATIVE
Ketones, ur: NEGATIVE mg/dL
Leukocytes,Ua: NEGATIVE
Nitrite: NEGATIVE
Protein, ur: NEGATIVE mg/dL
Specific Gravity, Urine: 1.008 (ref 1.005–1.030)
pH: 7 (ref 5.0–8.0)

## 2024-05-07 LAB — CBC WITH DIFFERENTIAL/PLATELET
Abs Immature Granulocytes: 0.02 10*3/uL (ref 0.00–0.07)
Basophils Absolute: 0.1 10*3/uL (ref 0.0–0.1)
Basophils Relative: 1 %
Eosinophils Absolute: 0.3 10*3/uL (ref 0.0–0.5)
Eosinophils Relative: 3 %
HCT: 39.6 % (ref 39.0–52.0)
Hemoglobin: 14.4 g/dL (ref 13.0–17.0)
Immature Granulocytes: 0 %
Lymphocytes Relative: 22 %
Lymphs Abs: 2.3 10*3/uL (ref 0.7–4.0)
MCH: 33.2 pg (ref 26.0–34.0)
MCHC: 36.4 g/dL — ABNORMAL HIGH (ref 30.0–36.0)
MCV: 91.2 fL (ref 80.0–100.0)
Monocytes Absolute: 0.9 10*3/uL (ref 0.1–1.0)
Monocytes Relative: 9 %
Neutro Abs: 6.7 10*3/uL (ref 1.7–7.7)
Neutrophils Relative %: 65 %
Platelets: 187 10*3/uL (ref 150–400)
RBC: 4.34 MIL/uL (ref 4.22–5.81)
RDW: 11.9 % (ref 11.5–15.5)
WBC: 10.3 10*3/uL (ref 4.0–10.5)
nRBC: 0 % (ref 0.0–0.2)

## 2024-05-07 LAB — LIPID PANEL
Cholesterol: 85 mg/dL (ref 0–200)
HDL: 43 mg/dL
LDL Cholesterol: 27 mg/dL (ref 0–99)
Total CHOL/HDL Ratio: 2 ratio
Triglycerides: 76 mg/dL
VLDL: 15 mg/dL (ref 0–40)

## 2024-05-07 LAB — POCT URINE DRUG SCREEN - MANUAL ENTRY (I-SCREEN)
POC Amphetamine UR: NOT DETECTED
POC Buprenorphine (BUP): NOT DETECTED
POC Cocaine UR: NOT DETECTED
POC Marijuana UR: NOT DETECTED
POC Methadone UR: NOT DETECTED
POC Methamphetamine UR: NOT DETECTED
POC Morphine: NOT DETECTED
POC Oxazepam (BZO): NOT DETECTED
POC Oxycodone UR: NOT DETECTED
POC Secobarbital (BAR): NOT DETECTED

## 2024-05-07 LAB — COMPREHENSIVE METABOLIC PANEL WITH GFR
ALT: 19 U/L (ref 0–44)
AST: 19 U/L (ref 15–41)
Albumin: 4.9 g/dL (ref 3.5–5.0)
Alkaline Phosphatase: 65 U/L (ref 38–126)
Anion gap: 12 (ref 5–15)
BUN: 11 mg/dL (ref 6–20)
CO2: 25 mmol/L (ref 22–32)
Calcium: 10.4 mg/dL — ABNORMAL HIGH (ref 8.9–10.3)
Chloride: 103 mmol/L (ref 98–111)
Creatinine, Ser: 0.72 mg/dL (ref 0.61–1.24)
GFR, Estimated: >60 mL/min
Glucose, Bld: 81 mg/dL (ref 70–99)
Potassium: 3.9 mmol/L (ref 3.5–5.1)
Sodium: 140 mmol/L (ref 135–145)
Total Bilirubin: 0.3 mg/dL (ref 0.0–1.2)
Total Protein: 7.8 g/dL (ref 6.5–8.1)

## 2024-05-07 LAB — GLUCOSE, CAPILLARY: Glucose-Capillary: 103 mg/dL — ABNORMAL HIGH (ref 70–99)

## 2024-05-07 LAB — VALPROIC ACID LEVEL: Valproic Acid Lvl: 87 ug/mL (ref 50–100)

## 2024-05-07 LAB — ETHANOL: Alcohol, Ethyl (B): <15 mg/dL

## 2024-05-07 LAB — MAGNESIUM: Magnesium: 2 mg/dL (ref 1.7–2.4)

## 2024-05-07 LAB — TSH: TSH: 4.41 u[IU]/mL (ref 0.350–4.500)

## 2024-05-07 MED ORDER — HALOPERIDOL LACTATE 5 MG/ML IJ SOLN: 10.0000 mg | Freq: Three times a day (TID) | INTRAMUSCULAR | Status: DC | PRN

## 2024-05-07 MED ORDER — DIPHENHYDRAMINE HCL 50 MG/ML IJ SOLN: 50.0000 mg | Freq: Three times a day (TID) | INTRAMUSCULAR | Status: DC | PRN

## 2024-05-07 MED ORDER — HYDROXYZINE HCL 25 MG PO TABS
25.0000 mg | ORAL_TABLET | Freq: Three times a day (TID) | ORAL | Status: DC | PRN
  Administered 2024-05-07: 25 mg via ORAL
  Filled 2024-05-07: qty 1

## 2024-05-07 MED ORDER — LORAZEPAM 2 MG/ML IJ SOLN: 2.0000 mg | Freq: Three times a day (TID) | INTRAMUSCULAR | Status: DC | PRN

## 2024-05-07 MED ORDER — DIPHENHYDRAMINE HCL 50 MG PO CAPS: 50.0000 mg | ORAL_CAPSULE | Freq: Three times a day (TID) | ORAL | Status: DC | PRN

## 2024-05-07 MED ORDER — ACETAMINOPHEN 325 MG PO TABS: 650.0000 mg | ORAL_TABLET | Freq: Four times a day (QID) | ORAL | Status: DC | PRN

## 2024-05-07 MED ORDER — HALOPERIDOL 5 MG PO TABS: 5.0000 mg | ORAL_TABLET | Freq: Three times a day (TID) | ORAL | Status: DC | PRN

## 2024-05-07 MED ORDER — MELATONIN 5 MG PO TABS
5.0000 mg | ORAL_TABLET | Freq: Every evening | ORAL | Status: DC | PRN
  Administered 2024-05-07: 5 mg via ORAL
  Filled 2024-05-07: qty 1

## 2024-05-07 MED ORDER — MAGNESIUM HYDROXIDE 400 MG/5ML PO SUSP
30.0000 mL | Freq: Every day | ORAL | Status: DC | PRN
  Administered 2024-05-07: 30 mL via ORAL
  Filled 2024-05-07: qty 30

## 2024-05-07 MED ORDER — ALUM & MAG HYDROXIDE-SIMETH 200-200-20 MG/5ML PO SUSP: 30.0000 mL | ORAL | Status: DC | PRN

## 2024-05-07 MED ORDER — HALOPERIDOL LACTATE 5 MG/ML IJ SOLN: 5.0000 mg | Freq: Three times a day (TID) | INTRAMUSCULAR | Status: DC | PRN

## 2024-05-07 NOTE — ED Notes (Signed)
Pt sleeping at present, no distress noted, ,monitoring for safety. 

## 2024-05-07 NOTE — ED Notes (Signed)
 Patient presented today via [employer] with complaint of S/I attempt via jumping out of a three story building. Patient appears to be in no acute distress, respirations are even and unlabored, patient reports no pain.
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
## 2024-05-07 NOTE — ED Notes (Signed)
 Patient has been brought on unit and familiarized with unit, food has been given, patient does not appear to be in any acute distress, respirations are even and unlabored, will continue to monitor patient for safety.

## 2024-05-08 MED ORDER — NICOTINE POLACRILEX 2 MG MT GUM
2.0000 mg | CHEWING_GUM | OROMUCOSAL | Status: DC | PRN
  Administered 2024-05-08: 2 mg via ORAL
  Filled 2024-05-08: qty 1

## 2024-05-08 NOTE — Discharge Instructions (Addendum)
 Discharging patient back to [facility]. Currently denies plan or intent to harm self or others now or after discharge. States [facility]. Please consider revisiting medications with outpatient Psychiatrist or mental health provider. Patient is on too many medications, and needs adjustments.  Get help right away if: You have thoughts about hurting yourself or others. Get help right away if you feel like you may hurt yourself or others, or have thoughts about taking your own life. Go to your nearest emergency room or: Call 911. Call the National Suicide Prevention Lifeline at 978-003-6521 or 988 in the [place] This is open 24 hours a day. If youre a Veteran: Call 988 and press 1. This is open 24 hours a day. Text the [employer] at (930)396-4956. Summary Mental health is not just the absence of mental illness. It involves understanding your emotions and behaviors, and taking steps to manage them in a healthy way. If you have symptoms of mental or emotional distress, get help from family, friends, a health care provider, or a mental health professional. Practice good mental health behaviors such as stress management skills, self-calming skills, exercise, healthy sleeping and eating, and supportive relationships. This information is not intended to replace advice given to you by your health care provider. Make sure you discuss any questions you have with your health care provider.  Education provided on the fact that if experiencing worsening of psychiatry symptoms including suicidal ideations, homicidal ideations, or having auditory/visual hallucinations, etc, to call 911, 988, come back to this location, or go to the nearest ER. Pt verbalized understanding.

## 2024-05-08 NOTE — ED Notes (Signed)
 Patient has been provided and educated on discharge summary, patients belongings were retrieved and patient escorted to group home staff in waiting room

## 2024-05-08 NOTE — ED Notes (Signed)
Patient was given breakfast.  

## 2024-05-08 NOTE — ED Notes (Signed)
 In speaking with patient this morning, this nurse advised the patient that he will more than likely be going back to group home today. Patient made statement that he does not want to go back to group home and will tell the providers so that he can stay here or go to a hospital. This nurse advised the patient to be truthful with staff however patient mentioned that he just does not want to go back to group home and does not like it there. This nurse will make provider aware of patients statement as there is previous documentation is patient chart of secondary gain.
